# Patient Record
Sex: Female | Born: 1955 | Race: White | Hispanic: No | Marital: Married | Smoking: Never smoker
Health system: Southern US, Community
[De-identification: ages and names within clinical notes are randomized; demographics above are authoritative.]

## PROBLEM LIST (undated history)

## (undated) DIAGNOSIS — Z87440 Personal history of urinary (tract) infections: Secondary | ICD-10-CM

## (undated) DIAGNOSIS — Z8709 Personal history of other diseases of the respiratory system: Secondary | ICD-10-CM

## (undated) DIAGNOSIS — Z8489 Family history of other specified conditions: Secondary | ICD-10-CM

## (undated) DIAGNOSIS — Z8701 Personal history of pneumonia (recurrent): Secondary | ICD-10-CM

## (undated) DIAGNOSIS — C50412 Malignant neoplasm of upper-outer quadrant of left female breast: Principal | ICD-10-CM

## (undated) HISTORY — DX: Malignant neoplasm of upper-outer quadrant of left female breast: C50.412

## (undated) HISTORY — PX: APPENDECTOMY: SHX54

## (undated) HISTORY — PX: ESOPHAGOGASTRODUODENOSCOPY: SHX1529

## (undated) HISTORY — PX: BREAST LUMPECTOMY: SHX2

## (undated) HISTORY — PX: COLONOSCOPY: SHX174

---

## 1999-04-29 ENCOUNTER — Other Ambulatory Visit: Admission: RE | Admit: 1999-04-29 | Discharge: 1999-04-29 | Payer: Self-pay | Admitting: Gynecology

## 1999-09-24 ENCOUNTER — Other Ambulatory Visit: Admission: RE | Admit: 1999-09-24 | Discharge: 1999-09-24 | Payer: Self-pay | Admitting: Gynecology

## 1999-10-31 ENCOUNTER — Encounter: Payer: Self-pay | Admitting: Gynecology

## 1999-10-31 ENCOUNTER — Encounter: Admission: RE | Admit: 1999-10-31 | Discharge: 1999-10-31 | Payer: Self-pay | Admitting: Gynecology

## 2000-06-10 ENCOUNTER — Other Ambulatory Visit: Admission: RE | Admit: 2000-06-10 | Discharge: 2000-06-10 | Payer: Self-pay | Admitting: Gynecology

## 2001-02-01 ENCOUNTER — Encounter: Payer: Self-pay | Admitting: Gynecology

## 2001-02-01 ENCOUNTER — Encounter: Admission: RE | Admit: 2001-02-01 | Discharge: 2001-02-01 | Payer: Self-pay | Admitting: Gynecology

## 2002-04-14 ENCOUNTER — Encounter: Admission: RE | Admit: 2002-04-14 | Discharge: 2002-04-14 | Payer: Self-pay | Admitting: Gynecology

## 2002-04-14 ENCOUNTER — Encounter: Payer: Self-pay | Admitting: Gynecology

## 2002-04-21 ENCOUNTER — Other Ambulatory Visit: Admission: RE | Admit: 2002-04-21 | Discharge: 2002-04-21 | Payer: Self-pay | Admitting: Gynecology

## 2003-05-25 ENCOUNTER — Encounter: Admission: RE | Admit: 2003-05-25 | Discharge: 2003-05-25 | Payer: Self-pay | Admitting: Gynecology

## 2003-06-08 ENCOUNTER — Other Ambulatory Visit: Admission: RE | Admit: 2003-06-08 | Discharge: 2003-06-08 | Payer: Self-pay | Admitting: Gynecology

## 2004-06-06 ENCOUNTER — Encounter: Admission: RE | Admit: 2004-06-06 | Discharge: 2004-06-06 | Payer: Self-pay | Admitting: Gynecology

## 2004-06-13 ENCOUNTER — Other Ambulatory Visit: Admission: RE | Admit: 2004-06-13 | Discharge: 2004-06-13 | Payer: Self-pay | Admitting: Gynecology

## 2004-06-20 ENCOUNTER — Encounter: Admission: RE | Admit: 2004-06-20 | Discharge: 2004-06-20 | Payer: Self-pay | Admitting: Gynecology

## 2005-07-03 ENCOUNTER — Encounter: Admission: RE | Admit: 2005-07-03 | Discharge: 2005-07-03 | Payer: Self-pay | Admitting: Gynecology

## 2006-05-21 ENCOUNTER — Other Ambulatory Visit: Admission: RE | Admit: 2006-05-21 | Discharge: 2006-05-21 | Payer: Self-pay | Admitting: Gynecology

## 2006-07-05 ENCOUNTER — Encounter: Admission: RE | Admit: 2006-07-05 | Discharge: 2006-07-05 | Payer: Self-pay | Admitting: Gynecology

## 2006-07-13 ENCOUNTER — Encounter: Admission: RE | Admit: 2006-07-13 | Discharge: 2006-07-13 | Payer: Self-pay | Admitting: Gynecology

## 2007-07-29 ENCOUNTER — Encounter: Admission: RE | Admit: 2007-07-29 | Discharge: 2007-07-29 | Payer: Self-pay | Admitting: Gynecology

## 2007-08-04 ENCOUNTER — Encounter: Admission: RE | Admit: 2007-08-04 | Discharge: 2007-08-04 | Payer: Self-pay | Admitting: Gynecology

## 2008-09-28 ENCOUNTER — Encounter: Admission: RE | Admit: 2008-09-28 | Discharge: 2008-09-28 | Payer: Self-pay | Admitting: Gynecology

## 2008-10-02 ENCOUNTER — Encounter: Admission: RE | Admit: 2008-10-02 | Discharge: 2008-10-02 | Payer: Self-pay | Admitting: Gynecology

## 2010-07-01 ENCOUNTER — Encounter
Admission: RE | Admit: 2010-07-01 | Discharge: 2010-07-01 | Payer: Self-pay | Source: Home / Self Care | Attending: Obstetrics and Gynecology | Admitting: Obstetrics and Gynecology

## 2010-07-26 ENCOUNTER — Other Ambulatory Visit: Payer: Self-pay | Admitting: Obstetrics and Gynecology

## 2010-07-26 DIAGNOSIS — Z1239 Encounter for other screening for malignant neoplasm of breast: Secondary | ICD-10-CM

## 2010-07-27 ENCOUNTER — Encounter: Payer: Self-pay | Admitting: Gynecology

## 2011-07-03 ENCOUNTER — Ambulatory Visit
Admission: RE | Admit: 2011-07-03 | Discharge: 2011-07-03 | Disposition: A | Discharge status: Home / Self Care | Payer: BC Managed Care – PPO | Source: Ambulatory Visit | Attending: Obstetrics and Gynecology | Admitting: Obstetrics and Gynecology

## 2011-07-03 ENCOUNTER — Other Ambulatory Visit: Payer: Self-pay | Admitting: Obstetrics and Gynecology

## 2011-07-03 DIAGNOSIS — Z1231 Encounter for screening mammogram for malignant neoplasm of breast: Secondary | ICD-10-CM

## 2011-07-03 DIAGNOSIS — Z1239 Encounter for other screening for malignant neoplasm of breast: Secondary | ICD-10-CM

## 2011-08-28 ENCOUNTER — Other Ambulatory Visit (HOSPITAL_COMMUNITY)
Admission: RE | Admit: 2011-08-28 | Discharge: 2011-08-28 | Disposition: A | Discharge status: Home / Self Care | Payer: BC Managed Care – PPO | Source: Ambulatory Visit | Attending: Family Medicine | Admitting: Family Medicine

## 2011-08-28 ENCOUNTER — Other Ambulatory Visit: Payer: Self-pay | Admitting: Family Medicine

## 2011-08-28 DIAGNOSIS — Z01419 Encounter for gynecological examination (general) (routine) without abnormal findings: Secondary | ICD-10-CM | POA: Insufficient documentation

## 2012-07-04 ENCOUNTER — Ambulatory Visit
Admission: RE | Admit: 2012-07-04 | Discharge: 2012-07-04 | Disposition: A | Discharge status: Home / Self Care | Payer: BC Managed Care – PPO | Source: Ambulatory Visit | Attending: Obstetrics and Gynecology | Admitting: Obstetrics and Gynecology

## 2012-07-04 ENCOUNTER — Other Ambulatory Visit: Payer: Self-pay | Admitting: Family Medicine

## 2012-07-04 DIAGNOSIS — Z1231 Encounter for screening mammogram for malignant neoplasm of breast: Secondary | ICD-10-CM

## 2012-11-01 ENCOUNTER — Other Ambulatory Visit: Payer: Self-pay | Admitting: Physician Assistant

## 2013-04-04 ENCOUNTER — Encounter: Payer: Self-pay | Admitting: Family Medicine

## 2013-04-04 ENCOUNTER — Ambulatory Visit (INDEPENDENT_AMBULATORY_CARE_PROVIDER_SITE_OTHER): Payer: BC Managed Care – PPO | Admitting: Family Medicine

## 2013-04-04 VITALS — Ht 66.0 in | Wt 163.8 lb

## 2013-04-04 DIAGNOSIS — E663 Overweight: Secondary | ICD-10-CM

## 2013-04-04 DIAGNOSIS — E162 Hypoglycemia, unspecified: Secondary | ICD-10-CM

## 2013-04-04 NOTE — Patient Instructions (Addendum)
-   Please see Dr. Cliffton Asters for a checkup and approval to start an exercise program that includes weight training.   - Recommendation:  Start walking at least 30 minutes 3 times a week.  - Record EACH DAY'S steps.    - Give yourself a fitness test such as climbing one flight of stairs, and repeat this after 4 weeks of doing your walking.  Record how long it takes as well as your perceived exertion (1-10).   - Sit to stand:  Practice this XX times per day.  You should feel some fatigue in your quads, which means you will be building strength.   - Obtain twice as many veg's as protein or carbohydrate foods for both lunch and dinner. - For the next 1-2 weeks, record what and how much you have for breakfast AND what time you ate it, as well as the time at which you first notice hunger.    - Some suggestions: You will probably be sustained longer with a breakfast with protein in it, and with FIBER in any starchy food you choose, i.e., in choosing a     cereal, look for at least 5 grams of fiber per serving.  Yogurt may work better than milk.  Less sugar at breakfast may also help your appetite control through     the day.  - TASTE PREFERENCES ARE LEARNED, AND PREFERENCE FOLLOWS PRACTICE.  This means that it will get easier to choose foods you know are good for you if you are exposed to them enough.    - Hot flashes:  You may want to see Jesusita Oka at [facility].  (Also consider asking Dr. Cliffton Asters about Vivelle dot estrogen patch and Prometrium progesterone.)  - Google SARCOPENIA and do another search on HYPER-PALATBLE FOODS and another search on SUGAR HEALTH EFFECTS.

## 2013-04-04 NOTE — Progress Notes (Signed)
Medical Nutrition Therapy:  Appt start time: 1330 end time:  1430.  Assessment:  Primary concerns today: Weight management.  Misty Lamb is still experiencing hot flashes, and often feels like she has a low-grade fever, although she does not.  She sometimes has symptoms of hypoglycemia, which are relieved by eating.  She usually cannot make it through the morning without a snack.  She also knows she needs to lose weight, and doing so would help her to feel better.   Usual eating pattern includes 3 meals and 2-3 snacks per day. Usual physical activity includes ~[id number] steps a day, tracked on a Gar[employer] steps tracker; she is participating in an activity competition at work through the end of October.  Her walking is just in activities of daily living; she has never participated in an exercise regimen.  Jaz said she often has trouble getting up and started walking b/c she feels so stiff and weak in her legs.  I suspect this is due to simple muscle loss, but encouraged her to see Dr. Cliffton Asters for an evaluation prior to beginning an exercise program other than walking 30 min 3 X wk.    24-hr recall: (Up at 5:30 AM) B (6 AM)-   3/4 c Cheerios, 1 c f-f milk, coffee w/ <1 tbsp cream Snk (10 AM)-   8 carrots, 1 laughing cow (35), water L (12 PM)-  Chipotle: grilled chx salad w/ chs & sour cream, chips, water Snk ( PM)-  none D (6:30 PM)-  2 c corn & butter beans, water Snk (9 PM)-  1 c lemonade, 1 WW toffee ice cream (90) Yesterday's lunch was heavier than usual b/c she went out w/ co-workers.  Usually brings lunch.    Progress Towards Goal(s):  In progress.   Nutritional Diagnosis:  NB-2.1 Physical inactivity As related to disinterest.  As evidenced by no regular activity.    Intervention:  Nutrition education.  Monitoring/Evaluation:  Dietary intake, exercise, and body weight in 4 week(s).

## 2013-05-02 ENCOUNTER — Ambulatory Visit: Payer: BC Managed Care – PPO | Admitting: Family Medicine

## 2013-06-06 ENCOUNTER — Other Ambulatory Visit: Payer: Self-pay | Admitting: Obstetrics and Gynecology

## 2013-06-22 ENCOUNTER — Ambulatory Visit: Payer: BC Managed Care – PPO | Admitting: Family Medicine

## 2013-07-04 ENCOUNTER — Other Ambulatory Visit: Payer: Self-pay

## 2013-07-04 DIAGNOSIS — Z1231 Encounter for screening mammogram for malignant neoplasm of breast: Secondary | ICD-10-CM

## 2013-07-05 ENCOUNTER — Ambulatory Visit
Admission: RE | Admit: 2013-07-05 | Discharge: 2013-07-05 | Disposition: A | Discharge status: Home / Self Care | Payer: BC Managed Care – PPO | Source: Ambulatory Visit

## 2013-07-05 DIAGNOSIS — Z1231 Encounter for screening mammogram for malignant neoplasm of breast: Secondary | ICD-10-CM

## 2013-07-13 ENCOUNTER — Other Ambulatory Visit: Payer: Self-pay | Admitting: Obstetrics and Gynecology

## 2013-07-13 DIAGNOSIS — R928 Other abnormal and inconclusive findings on diagnostic imaging of breast: Secondary | ICD-10-CM

## 2013-07-21 ENCOUNTER — Ambulatory Visit
Admission: RE | Admit: 2013-07-21 | Discharge: 2013-07-21 | Disposition: A | Discharge status: Home / Self Care | Payer: BC Managed Care – PPO | Source: Ambulatory Visit | Attending: Obstetrics and Gynecology | Admitting: Obstetrics and Gynecology

## 2013-07-21 DIAGNOSIS — R928 Other abnormal and inconclusive findings on diagnostic imaging of breast: Secondary | ICD-10-CM

## 2013-12-07 ENCOUNTER — Other Ambulatory Visit: Payer: Self-pay | Admitting: Obstetrics and Gynecology

## 2013-12-07 DIAGNOSIS — N6489 Other specified disorders of breast: Secondary | ICD-10-CM

## 2013-12-18 ENCOUNTER — Ambulatory Visit
Admission: RE | Admit: 2013-12-18 | Discharge: 2013-12-18 | Disposition: A | Discharge status: Home / Self Care | Payer: BC Managed Care – PPO | Source: Ambulatory Visit | Attending: Obstetrics and Gynecology | Admitting: Obstetrics and Gynecology

## 2013-12-18 DIAGNOSIS — N6489 Other specified disorders of breast: Secondary | ICD-10-CM

## 2014-07-13 ENCOUNTER — Other Ambulatory Visit: Payer: Self-pay | Admitting: Obstetrics and Gynecology

## 2014-07-13 DIAGNOSIS — N6320 Unspecified lump in the left breast, unspecified quadrant: Secondary | ICD-10-CM

## 2014-07-24 ENCOUNTER — Other Ambulatory Visit: Payer: Self-pay | Admitting: Obstetrics and Gynecology

## 2014-07-24 ENCOUNTER — Ambulatory Visit
Admission: RE | Admit: 2014-07-24 | Discharge: 2014-07-24 | Disposition: A | Discharge status: Home / Self Care | Payer: BLUE CROSS/BLUE SHIELD | Source: Ambulatory Visit | Attending: Obstetrics and Gynecology | Admitting: Obstetrics and Gynecology

## 2014-07-24 DIAGNOSIS — N6320 Unspecified lump in the left breast, unspecified quadrant: Secondary | ICD-10-CM

## 2014-09-12 ENCOUNTER — Other Ambulatory Visit: Payer: Self-pay | Admitting: Obstetrics and Gynecology

## 2014-09-13 LAB — CYTOLOGY - PAP

## 2015-01-17 ENCOUNTER — Other Ambulatory Visit: Payer: Self-pay | Admitting: Family Medicine

## 2015-01-17 DIAGNOSIS — N6320 Unspecified lump in the left breast, unspecified quadrant: Secondary | ICD-10-CM

## 2015-01-31 ENCOUNTER — Other Ambulatory Visit: Payer: Self-pay | Admitting: Family Medicine

## 2015-01-31 ENCOUNTER — Ambulatory Visit
Admission: RE | Admit: 2015-01-31 | Discharge: 2015-01-31 | Disposition: A | Discharge status: Home / Self Care | Payer: BLUE CROSS/BLUE SHIELD | Source: Ambulatory Visit | Attending: Family Medicine | Admitting: Family Medicine

## 2015-01-31 DIAGNOSIS — N6320 Unspecified lump in the left breast, unspecified quadrant: Secondary | ICD-10-CM

## 2015-01-31 DIAGNOSIS — R921 Mammographic calcification found on diagnostic imaging of breast: Secondary | ICD-10-CM

## 2015-02-04 ENCOUNTER — Encounter: Payer: Self-pay | Admitting: *Deleted

## 2015-02-04 ENCOUNTER — Telehealth: Payer: Self-pay | Admitting: *Deleted

## 2015-02-04 DIAGNOSIS — C50412 Malignant neoplasm of upper-outer quadrant of left female breast: Secondary | ICD-10-CM

## 2015-02-04 HISTORY — DX: Malignant neoplasm of upper-outer quadrant of left female breast: C50.412

## 2015-02-04 NOTE — Telephone Encounter (Signed)
Confirmed [facility] for 02/06/15 at 0800 .  Instructions and contact information given.

## 2015-02-05 ENCOUNTER — Telehealth: Payer: Self-pay | Admitting: *Deleted

## 2015-02-05 DIAGNOSIS — C50412 Malignant neoplasm of upper-outer quadrant of left female breast: Secondary | ICD-10-CM

## 2015-02-05 NOTE — Telephone Encounter (Signed)
Pt called to relate she needed to change to he pm clinic. Appt changed and [facility] practice notified.

## 2015-02-06 ENCOUNTER — Ambulatory Visit: Payer: BLUE CROSS/BLUE SHIELD

## 2015-02-06 ENCOUNTER — Encounter: Payer: Self-pay | Admitting: Hematology and Oncology

## 2015-02-06 ENCOUNTER — Ambulatory Visit (HOSPITAL_BASED_OUTPATIENT_CLINIC_OR_DEPARTMENT_OTHER): Payer: BLUE CROSS/BLUE SHIELD | Admitting: Hematology and Oncology

## 2015-02-06 ENCOUNTER — Other Ambulatory Visit: Payer: BLUE CROSS/BLUE SHIELD

## 2015-02-06 ENCOUNTER — Encounter: Payer: Self-pay | Admitting: General Practice

## 2015-02-06 ENCOUNTER — Encounter (INDEPENDENT_AMBULATORY_CARE_PROVIDER_SITE_OTHER): Payer: Self-pay

## 2015-02-06 ENCOUNTER — Ambulatory Visit
Admission: RE | Admit: 2015-02-06 | Discharge: 2015-02-06 | Disposition: A | Discharge status: Home / Self Care | Payer: BLUE CROSS/BLUE SHIELD | Source: Ambulatory Visit | Attending: Radiation Oncology | Admitting: Radiation Oncology

## 2015-02-06 ENCOUNTER — Other Ambulatory Visit: Payer: Self-pay | Admitting: General Surgery

## 2015-02-06 ENCOUNTER — Ambulatory Visit: Payer: BLUE CROSS/BLUE SHIELD | Admitting: Oncology

## 2015-02-06 ENCOUNTER — Encounter: Payer: Self-pay | Admitting: Nurse Practitioner

## 2015-02-06 ENCOUNTER — Other Ambulatory Visit (HOSPITAL_BASED_OUTPATIENT_CLINIC_OR_DEPARTMENT_OTHER): Payer: BLUE CROSS/BLUE SHIELD

## 2015-02-06 VITALS — BP 163/85 | HR 78 | Temp 98.3°F | Resp 18 | Ht 66.0 in | Wt 159.0 lb

## 2015-02-06 DIAGNOSIS — D0512 Intraductal carcinoma in situ of left breast: Secondary | ICD-10-CM

## 2015-02-06 DIAGNOSIS — C50412 Malignant neoplasm of upper-outer quadrant of left female breast: Secondary | ICD-10-CM

## 2015-02-06 DIAGNOSIS — Z170 Estrogen receptor positive status [ER+]: Secondary | ICD-10-CM | POA: Diagnosis not present

## 2015-02-06 DIAGNOSIS — Z803 Family history of malignant neoplasm of breast: Secondary | ICD-10-CM

## 2015-02-06 LAB — COMPREHENSIVE METABOLIC PANEL (CC13)
ALK PHOS: 73 U/L (ref 40–150)
ALT: 19 U/L (ref 0–55)
AST: 17 U/L (ref 5–34)
Albumin: 4.1 g/dL (ref 3.5–5.0)
Anion Gap: 6 mEq/L (ref 3–11)
BILIRUBIN TOTAL: 0.69 mg/dL (ref 0.20–1.20)
BUN: 19.2 mg/dL (ref 7.0–26.0)
CALCIUM: 9.3 mg/dL (ref 8.4–10.4)
CHLORIDE: 108 meq/L (ref 98–109)
CO2: 27 mEq/L (ref 22–29)
CREATININE: 0.7 mg/dL (ref 0.6–1.1)
Glucose: 95 mg/dl (ref 70–140)
POTASSIUM: 4 meq/L (ref 3.5–5.1)
Sodium: 142 mEq/L (ref 136–145)
Total Protein: 7.6 g/dL (ref 6.4–8.3)

## 2015-02-06 LAB — CBC WITH DIFFERENTIAL/PLATELET
BASO%: 0.2 % (ref 0.0–2.0)
Basophils Absolute: 0 10*3/uL (ref 0.0–0.1)
EOS%: 1.1 % (ref 0.0–7.0)
Eosinophils Absolute: 0.1 10*3/uL (ref 0.0–0.5)
HCT: 43.2 % (ref 34.8–46.6)
HEMOGLOBIN: 14.4 g/dL (ref 11.6–15.9)
LYMPH%: 20.2 % (ref 14.0–49.7)
MCH: 29.5 pg (ref 25.1–34.0)
MCHC: 33.3 g/dL (ref 31.5–36.0)
MCV: 88.5 fL (ref 79.5–101.0)
MONO#: 0.6 10*3/uL (ref 0.1–0.9)
MONO%: 6.9 % (ref 0.0–14.0)
NEUT#: 6.7 10*3/uL — ABNORMAL HIGH (ref 1.5–6.5)
NEUT%: 71.6 % (ref 38.4–76.8)
Platelets: 240 10*3/uL (ref 145–400)
RBC: 4.88 10*6/uL (ref 3.70–5.45)
RDW: 12.5 % (ref 11.2–14.5)
WBC: 9.3 10*3/uL (ref 3.9–10.3)
lymph#: 1.9 10*3/uL (ref 0.9–3.3)

## 2015-02-06 NOTE — Progress Notes (Signed)
Checked in new patient with no issues prior to seeing the dr. She has not traveled and has been given her breast care alliance packet. I advised her of alight fund and gave her Lenise's card if needed.

## 2015-02-06 NOTE — Progress Notes (Signed)
Misty Lamb is a very pleasant 59 y.o. 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  Biopsy results revealed the tumor's prognostic profile is ER positive and PR positive.  She presents today with her husband and daughter to the [facility]) for treatment consideration and recommendations from the breast surgeon, radiation oncologist, and medical oncologist.     I briefly met with Misty Lamb, her husband, and her daughter during her [facility] visit today. We discussed the purpose of the Survivorship Clinic, which will include monitoring for recurrence, coordinating completion of age and gender-appropriate cancer screenings, promotion of overall wellness, as well as managing potential late/long-term side effects of anti-cancer treatments.    The treatment plan for Misty Lamb will likely include surgery, radiation therapy, and anti-estrogen therapy.  As of today, the intent of treatment for Misty Lamb is cure, therefore she will be eligible for the Survivorship Clinic upon her completion of treatment.  Her survivorship care plan (SCP) document will be drafted and updated throughout the course of her treatment trajectory. She will receive the SCP in an office visit with myself in the Survivorship Clinic once she has completed treatment.   Misty Lamb was encouraged to ask questions and all questions were answered to her satisfaction.  She was given my business card and encouraged to contact me with any concerns regarding survivorship.  I look forward to participating in her care.   Kenn File, [place] [phone]

## 2015-02-06 NOTE — Progress Notes (Addendum)
[facility] NEW PATIENT EVALUATION  Name: Misty Lamb MRN: [id number]  Date:   02/06/2015           DOB: 11-21-1955  Status: outpatient   CC: Vidal Schwalbe, MD Dr. Autumn Messing, III   REFERRING PHYSICIAN:  Dr. 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 female who is seen today through the courtesy of Dr. Marlou Starks at the breast multidisciplinary clinic for evaluation of her DCIS of the left breast.  At the time of a six-month follow-up mammogram for what was found to represent a fibroadenoma, she was noted to have a 3 x 3 x 4 mm area of suspicious calcifications within the upper-outer quadrant.  There were a few calcifications posterior to this tight group of calcifications; altogether the calcifications including the dominant group measured 1.0 x 1.1 x 1.5 cm.  Ultrasound did not show any change in a mass at 3:00 measuring 7 x 6 x 4 mm felt to represent a fibroadenoma.  A biopsy of the calcifications on 01/31/2015 revealed low-grade DCIS with calcifications and also "fibroadenoma".  The DCIS was ER/PR positive.  She is without complaints today.  She is seen today with Dr. Marlou Starks and Dr. Lindi Adie.  PREVIOUS RADIATION THERAPY: No   PAST MEDICAL HISTORY:  has a past medical history of Breast cancer of upper-outer quadrant of left female breast (02/04/2015).     PAST SURGICAL HISTORY:  Past Surgical History  Procedure Laterality Date  . Appendectomy       FAMILY HISTORY: family history includes Breast cancer in her mother; Liver cancer in her mother.  Her father died of a stroke at 56 and her mother died of what sounds like hepatocellular carcinoma at age 22    SOCIAL HISTORY:  reports that she has never smoked. She has never used smokeless tobacco. She reports that she does not drink alcohol or use illicit drugs.  Married, 3 children.     ALLERGIES: Review of patient's  allergies indicates not on file.   MEDICATIONS:  Current Outpatient Prescriptions  Medication Sig Dispense Refill  . Multiple Vitamins-Minerals (MULTIVITAMIN ADULT PO) Take by mouth.     No current facility-administered medications for this encounter.     REVIEW OF SYSTEMS:  Pertinent items are noted in HPI.    PHYSICAL EXAM:  alert and oriented 59 year old white female appearing younger than her stated age.   Wt Readings from Last 3 Encounters:  02/06/15 159 lb (72.122 kg)  04/04/13 163 lb 12.8 oz (74.299 kg)   Temp Readings from Last 3 Encounters:  02/06/15 98.3 F (36.8 C) Oral   BP Readings from Last 3 Encounters:  02/06/15 163/85   Pulse Readings from Last 3 Encounters:  02/06/15 78   Head and neck examination: Grossly unremarkable.  Nodes: Without palpable cervical, supraclavicular, or axillary lymphadenopathy.  Breasts: There is a biopsy wound at approximately 1:00 along the upper outer quadrant of the left breast.  No masses are appreciated.  Right breast without masses or lesions.  Extremities: Without edema.      LABORATORY DATA:  Lab Results  Component Value Date   WBC 9.3 02/06/2015   HGB 14.4 02/06/2015   HCT 43.2 02/06/2015   MCV 88.5 02/06/2015   PLT 240 02/06/2015   Lab Results  Component Value Date   NA 142 02/06/2015   K 4.0  02/06/2015   CO2 27 02/06/2015   Lab Results  Component Value Date   ALT 19 02/06/2015   AST 17 02/06/2015   ALKPHOS 73 02/06/2015   BILITOT 0.69 02/06/2015      IMPRESSION: Stage 0 (Tis N0 M0) low-grade DCIS of the left breast.  We discussed local management options which in fluid mastectomy versus partial mastectomy followed by radiation therapy.  We discussed the potential acute and late toxicities of radiation therapy.  She may be a candidate for hypofractionated treatment.  We also discussed obtaining a mammogram prior to initiation of radiation therapy to confirm removal of all suspicious microcalcifications.  We  also discussed deep inspiration breath-hold technology to avoid cardiac irradiation.    PLAN: As discussed above.     I spent 30 minutes minutes face to face with the patient and more than 50% of that time was spent in counseling and/or coordination of care.

## 2015-02-06 NOTE — Progress Notes (Signed)
[facility] NOTE  Patient Care Team: Harlan Stains, MD as PCP - General (Family Medicine) Kennith Center, RD as Dietitian (Family Medicine) Arloa Koh, MD as Consulting Physician (Radiation Oncology) Mauro Kaufmann, RN as Registered Nurse Rockwell Germany, RN as Registered Nurse Autumn Messing III, MD as Consulting Physician (General Surgery) Nicholas Lose, MD as Consulting Physician (Hematology and Oncology)  CHIEF COMPLAINTS/PURPOSE OF CONSULTATION:  Newly diagnosed breast cancer  HISTORY OF PRESENTING ILLNESS:  Misty Lamb 59 y.o. female is here because of recent diagnosis of left breast DCIS. She had a previous mammogram 6 months ago. This showed some abnormalities of her followed up by a repeat mammogram recently which showed left breast calcifications plus a fibroadenoma. The calcifications span 15 mm in size. The fibroadenoma was found to be stable at 3:00 position measuring 7 mm. Ultrasound was performed and a biopsy revealed low-grade DCIS with fibroadenoma. It was ER/PR positive. She was presented this morning at the multidisciplinary tumor board and she is here today to discuss a treatment plan.   I reviewed her records extensively and collaborated the history with the patient.  SUMMARY OF ONCOLOGIC HISTORY:   Breast cancer of upper-outer quadrant of left female breast   01/31/2015 Mammogram Six-month reevaluation of left breast calcifications showed increase in calcifications 15 mm span; fibroadenoma measuring 7 mm the 3:00 position stable   01/31/2015 Initial Diagnosis Left breast biopsy: DCIS with calcifications, fibroadenoma, low-grade, ER 100%, PR 10%    In terms of breast cancer risk profile:  She menarched at early age of 50 and went to menopause at age 28  She had 3 pregnancy, her first child was born at age 17  She has received birth control pills for approximately 2 years.  She was never exposed to fertility medications or hormone replacement  therapy.  She has  family history of Breast/GYN/GI cancer Mother age 60 with breast cancer  MEDICAL HISTORY:  Past Medical History  Diagnosis Date  . Breast cancer of upper-outer quadrant of left female breast 02/04/2015    SURGICAL HISTORY: Past Surgical History  Procedure Laterality Date  . Appendectomy      SOCIAL HISTORY: History   Social History  . Marital Status: Married    Spouse Name: N/A  . Number of Children: N/A  . Years of Education: N/A   Occupational History  . Not on file.   Social History Main Topics  . Smoking status: Never Smoker   . Smokeless tobacco: Never Used  . Alcohol Use: No  . Drug Use: No  . Sexual Activity: Not on file   Other Topics Concern  . Not on file   Social History Narrative    FAMILY HISTORY: Family History  Problem Relation Age of Onset  . Breast cancer Mother   . Liver cancer Mother     ALLERGIES:  has no allergies on file.  MEDICATIONS:  Current Outpatient Prescriptions  Medication Sig Dispense Refill  . Multiple Vitamins-Minerals (MULTIVITAMIN ADULT PO) Take by mouth.     No current facility-administered medications for this visit.    REVIEW OF SYSTEMS:   Constitutional: Denies fevers, chills or abnormal night sweats Eyes: Denies blurriness of vision, double vision or watery eyes Ears, nose, mouth, throat, and face: Denies mucositis or sore throat Respiratory: Denies cough, dyspnea or wheezes Cardiovascular: Denies palpitation, chest discomfort or lower extremity swelling Gastrointestinal:  Denies nausea, heartburn or change in bowel habits Skin: Denies abnormal skin rashes Lymphatics: Denies new  lymphadenopathy or easy bruising Neurological:Denies numbness, tingling or new weaknesses Behavioral/Psych: Mood is stable, no new changes  Breast:  Denies any palpable lumps or discharge All other systems were reviewed with the patient and are negative.  PHYSICAL EXAMINATION: ECOG PERFORMANCE STATUS: 0 -  Asymptomatic  Filed Vitals:   02/06/15 1435  BP: 163/85  Pulse: 78  Temp: 98.3 F (36.8 C)  Resp: 18   Filed Weights   02/06/15 1435  Weight: 159 lb (72.122 kg)    GENERAL:alert, no distress and comfortable SKIN: skin color, texture, turgor are normal, no rashes or significant lesions EYES: normal, conjunctiva are pink and non-injected, sclera clear OROPHARYNX:no exudate, no erythema and lips, buccal mucosa, and tongue normal  NECK: supple, thyroid normal size, non-tender, without nodularity LYMPH:  no palpable lymphadenopathy in the cervical, axillary or inguinal LUNGS: clear to auscultation and percussion with normal breathing effort HEART: regular rate & rhythm and no murmurs and no lower extremity edema ABDOMEN:abdomen soft, non-tender and normal bowel sounds Musculoskeletal:no cyanosis of digits and no clubbing  PSYCH: alert & oriented x 3 with fluent speech NEURO: no focal motor/sensory deficits BREAST: No palpable nodules in breast. No palpable axillary or supraclavicular lymphadenopathy (exam performed in the presence of a chaperone)   LABORATORY DATA:  I have reviewed the data as listed Lab Results  Component Value Date   WBC 9.3 02/06/2015   HGB 14.4 02/06/2015   HCT 43.2 02/06/2015   MCV 88.5 02/06/2015   PLT 240 02/06/2015   Lab Results  Component Value Date   NA 142 02/06/2015   K 4.0 02/06/2015   CO2 27 02/06/2015    RADIOGRAPHIC STUDIES: I have personally reviewed the radiological reports and agreed with the findings in the report. Results are summarized as above  ASSESSMENT AND PLAN:  Breast cancer of upper-outer quadrant of left female breast Diagnosis: Left breast biopsy 01/23/2015: DCIS with calcifications, fibroadenoma, low-grade, ER 100%, PR 10% Radiology review:Six-month reevaluation of left breast calcifications showed increase in calcifications 15 mm span; fibroadenoma measuring 7 mm the 3:00 position stable  Pathology review: I discussed  with the patient the difference between DCIS and invasive breast cancer. It is considered a precancerous lesion. DCIS is classified as a 0. It is generally detected through mammograms as calcifications. We discussed the significance of grades and its impact on prognosis. We also discussed the importance of ER and PR receptors and their implications to adjuvant treatment options. Prognosis of DCIS dependence on grade and comedo necrosis. It is anticipated that if not treated, 20-30% of DCIS can develop into invasive breast cancer.  Recommendation: 1. Breast conserving surgery 2. Followed by adjuvant radiation therapy 3. Followed by antiestrogen therapy with tamoxifen 5 years  Tamoxifen counseling: We discussed the risks and benefits of tamoxifen. These include but not limited to insomnia, hot flashes, mood changes, vaginal dryness, and weight gain. Although rare, serious side effects including endometrial cancer, risk of blood clots were also discussed. We strongly believe that the benefits far outweigh the risks. Patient understands these risks and consented to starting treatment. Planned treatment duration is 5 years.  Return to clinic after surgery to discuss the final pathology report and come up with an adjuvant treatment plan.     All questions were answered. The patient knows to call the clinic with any problems, questions or concerns.    Rulon Eisenmenger, MD 2:41 PM

## 2015-02-06 NOTE — Assessment & Plan Note (Signed)
Diagnosis: Left breast biopsy 01/23/2015: DCIS with calcifications, fibroadenoma, low-grade, ER 100%, PR 10% Radiology review:Six-month reevaluation of left breast calcifications showed increase in calcifications 15 mm span; fibroadenoma measuring 7 mm the 3:00 position stable  Pathology review: I discussed with the patient the difference between DCIS and invasive breast cancer. It is considered a precancerous lesion. DCIS is classified as a 0. It is generally detected through mammograms as calcifications. We discussed the significance of grades and its impact on prognosis. We also discussed the importance of ER and PR receptors and their implications to adjuvant treatment options. Prognosis of DCIS dependence on grade and comedo necrosis. It is anticipated that if not treated, 20-30% of DCIS can develop into invasive breast cancer.  Recommendation: 1. Breast conserving surgery 2. Followed by adjuvant radiation therapy 3. Followed by antiestrogen therapy with tamoxifen 5 years  Tamoxifen counseling: We discussed the risks and benefits of tamoxifen. These include but not limited to insomnia, hot flashes, mood changes, vaginal dryness, and weight gain. Although rare, serious side effects including endometrial cancer, risk of blood clots were also discussed. We strongly believe that the benefits far outweigh the risks. Patient understands these risks and consented to starting treatment. Planned treatment duration is 5 years.  Return to clinic after surgery to discuss the final pathology report and come up with an adjuvant treatment plan.

## 2015-02-07 ENCOUNTER — Other Ambulatory Visit: Payer: Self-pay | Admitting: General Surgery

## 2015-02-07 DIAGNOSIS — D0512 Intraductal carcinoma in situ of left breast: Secondary | ICD-10-CM

## 2015-02-07 NOTE — Progress Notes (Signed)
[place] Psychosocial Distress Screening Spiritual Care  Met with Misty Lamb, her husband, and their oldest daughter at [facility] to introduce [facility] team/resources, reviewing distress screen per protocol.  The patient scored a 2 on the Psychosocial Distress Thermometer which indicates mild distress. Also assessed for distress and other psychosocial needs.   ONCBCN DISTRESS SCREENING 02/07/2015  Screening Type Initial Screening  Distress experienced in past week (1-10) 2  Referral to support programs Yes  Other [place] and husband have been married 86 years; per pt, husband, three children, and grandchildren are strong sources of support.  Pt reports good support from her [employer] in [place]; prayer and faith are of central importance to her in meaning-making and in coping with challenges.  Per pt, her faith is keeping her distress level low:  "I have complete peace that God is in control of my situation."  She and her family were upbeat and relieved at clinic, citing encouraging news and quality care from medical team.    Follow up needed: No.  Family is aware of ongoing chaplain availability for support as desired and has print materials about [facility] and team contact info.  Please also page as needs arise.  Thank you.  [place], [place] Pager [phone] Voicemail  [phone]

## 2015-02-11 ENCOUNTER — Telehealth: Payer: Self-pay | Admitting: Hematology and Oncology

## 2015-02-11 ENCOUNTER — Telehealth: Payer: Self-pay | Admitting: *Deleted

## 2015-02-11 ENCOUNTER — Other Ambulatory Visit: Payer: Self-pay | Admitting: *Deleted

## 2015-02-11 NOTE — Telephone Encounter (Signed)
Patient is aware of her post op appointment

## 2015-02-11 NOTE — Telephone Encounter (Signed)
Spoke with patient from [facility] 8/3.  She is doing well.  She is scheduled for surgery 8/25.  No questions or concerns at this time.  Encouraged her to call with any needs or concerns.

## 2015-02-20 ENCOUNTER — Encounter (HOSPITAL_COMMUNITY): Payer: Self-pay

## 2015-02-20 ENCOUNTER — Encounter (HOSPITAL_COMMUNITY)
Admission: RE | Admit: 2015-02-20 | Discharge: 2015-02-20 | Disposition: A | Discharge status: Home / Self Care | Payer: BLUE CROSS/BLUE SHIELD | Source: Ambulatory Visit | Attending: General Surgery | Admitting: General Surgery

## 2015-02-20 DIAGNOSIS — D0512 Intraductal carcinoma in situ of left breast: Secondary | ICD-10-CM | POA: Insufficient documentation

## 2015-02-20 DIAGNOSIS — Z01812 Encounter for preprocedural laboratory examination: Secondary | ICD-10-CM | POA: Insufficient documentation

## 2015-02-20 HISTORY — DX: Personal history of urinary (tract) infections: Z87.440

## 2015-02-20 HISTORY — DX: Personal history of pneumonia (recurrent): Z87.01

## 2015-02-20 HISTORY — DX: Personal history of other diseases of the respiratory system: Z87.09

## 2015-02-20 HISTORY — DX: Family history of other specified conditions: Z84.89

## 2015-02-20 LAB — BASIC METABOLIC PANEL
Anion gap: 8 (ref 5–15)
BUN: 10 mg/dL (ref 6–20)
CHLORIDE: 105 mmol/L (ref 101–111)
CO2: 28 mmol/L (ref 22–32)
Calcium: 9.2 mg/dL (ref 8.9–10.3)
Creatinine, Ser: 0.58 mg/dL (ref 0.44–1.00)
GFR calc Af Amer: >60 mL/min (ref >60)
GLUCOSE: 96 mg/dL (ref 65–99)
Potassium: 3.6 mmol/L (ref 3.5–5.1)
SODIUM: 141 mmol/L (ref 135–145)

## 2015-02-20 LAB — CBC
HCT: 43.9 % (ref 36.0–46.0)
Hemoglobin: 14.7 g/dL (ref 12.0–15.0)
MCH: 29.6 pg (ref 26.0–34.0)
MCHC: 33.5 g/dL (ref 30.0–36.0)
MCV: 88.5 fL (ref 78.0–100.0)
Platelets: 260 10*3/uL (ref 150–400)
RBC: 4.96 MIL/uL (ref 3.87–5.11)
RDW: 12.8 % (ref 11.5–15.5)
WBC: 6.7 10*3/uL (ref 4.0–10.5)

## 2015-02-20 NOTE — Progress Notes (Signed)
PCP- Dr. Harlan Stains Cardiologist - Denies EKG/CXR - denies Echo/Stress Test/Cardiac Cath - pt. Denies  Pt. Denies shortness of breath and denies chest pain.

## 2015-02-20 NOTE — Pre-Procedure Instructions (Signed)
Misty Lamb  02/20/2015      [id number] - [place], [place] - [place] [address] [place]y Lamb [id number] Phone: [phone] Fax: [phone]    Your procedure is scheduled on Thursday, February 28, 2015  Report to [facility] Admitting at 7:30 A.M.  Call this number if you have problems the morning of surgery:  [phone]   Remember:  Do not eat food or drink liquids after midnight Wednesday, February 27, 2015  Take these medicines the morning of surgery with A SIP OF WATER: None  Stop taking Aspirin, vitamins and herbal medications. Do not take any NSAIDs ie: Ibuprofen, Advil, Naproxen or any medication containing Aspirin; stop 1 week prior to procedure ( Thursday, February 21, 2015)  Do not wear jewelry, make-up or nail polish.  Do not wear lotions, powders, or perfumes.  You may not wear deodorant.  Do not shave 48 hours prior to surgery.    Do not bring valuables to the hospital.  [facility] is not responsible for any belongings or valuables.  Contacts, dentures or bridgework may not be worn into surgery.  Leave your suitcase in the car.  After surgery it may be brought to your room.  For patients admitted to the hospital, discharge time will be determined by your treatment team.  Patients discharged the day of surgery will not be allowed to drive home.   Name and phone number of your driver:   Special instructions: Special Instructions:Special Instructions: [facility] - Preparing for Surgery  Before surgery, you can play an important role.  Because skin is not sterile, your skin needs to be as free of germs as possible.  You can reduce the number of germs on you skin by washing with CHG (chlorahexidine gluconate) soap before surgery.  CHG is an antiseptic cleaner which kills germs and bonds with the skin to continue killing germs even after washing.  Please DO NOT use if you have an allergy to CHG or antibacterial soaps.  If  your skin becomes reddened/irritated stop using the CHG and inform your nurse when you arrive at Short Stay.  Do not shave (including legs and underarms) for at least 48 hours prior to the first CHG shower.  You may shave your face.  Please follow these instructions carefully:   1.  Shower with CHG Soap the night before surgery and the morning of Surgery.  2.  If you choose to wash your hair, wash your hair first as usual with your normal shampoo.  3.  After you shampoo, rinse your hair and body thoroughly to remove the Shampoo.  4.  Use CHG as you would any other liquid soap.  You can apply chg directly  to the skin and wash gently with scrungie or a clean washcloth.  5.  Apply the CHG Soap to your body ONLY FROM THE NECK DOWN.  Do not use on open wounds or open sores.  Avoid contact with your eyes, ears, mouth and genitals (private parts).  Wash genitals (private parts) with your normal soap.  6.  Wash thoroughly, paying special attention to the area where your surgery will be performed.  7.  Thoroughly rinse your body with warm water from the neck down.  8.  DO NOT shower/wash with your normal soap after using and rinsing off the CHG Soap.  9.  Pat yourself dry with a clean towel.  10.  Wear clean pajamas.            11.  Place clean sheets on your bed the night of your first shower and do not sleep with pets.  Day of Surgery  Do not apply any lotions/deodorants the morning of surgery.  Please wear clean clothes to the hospital/surgery center.  Please read over the following fact sheets that you were given. Pain Booklet, Coughing and Deep Breathing and Surgical Site Infection Prevention

## 2015-02-25 ENCOUNTER — Ambulatory Visit
Admission: RE | Admit: 2015-02-25 | Discharge: 2015-02-25 | Disposition: A | Discharge status: Home / Self Care | Payer: BLUE CROSS/BLUE SHIELD | Source: Ambulatory Visit | Attending: General Surgery | Admitting: General Surgery

## 2015-02-25 DIAGNOSIS — D0512 Intraductal carcinoma in situ of left breast: Secondary | ICD-10-CM

## 2015-02-27 MED ORDER — CHLORHEXIDINE GLUCONATE 4 % EX LIQD: 1.0000 "application " | Freq: Once | CUTANEOUS | Status: DC

## 2015-02-27 MED ORDER — CEFAZOLIN SODIUM-DEXTROSE 2-3 GM-% IV SOLR
2.0000 g | INTRAVENOUS | Status: AC
  Administered 2015-02-28: 2 g via INTRAVENOUS
  Filled 2015-02-27: qty 50

## 2015-02-28 ENCOUNTER — Telehealth: Payer: Self-pay

## 2015-02-28 ENCOUNTER — Ambulatory Visit
Admission: RE | Admit: 2015-02-28 | Discharge: 2015-02-28 | Disposition: A | Discharge status: Home / Self Care | Payer: BLUE CROSS/BLUE SHIELD | Source: Ambulatory Visit | Attending: General Surgery | Admitting: General Surgery

## 2015-02-28 ENCOUNTER — Encounter (HOSPITAL_COMMUNITY): Payer: Self-pay | Admitting: General Practice

## 2015-02-28 ENCOUNTER — Ambulatory Visit (HOSPITAL_COMMUNITY): Payer: BLUE CROSS/BLUE SHIELD | Admitting: Anesthesiology

## 2015-02-28 ENCOUNTER — Ambulatory Visit (HOSPITAL_COMMUNITY)
Admission: RE | Admit: 2015-02-28 | Discharge: 2015-02-28 | Disposition: A | Discharge status: Home / Self Care | Payer: BLUE CROSS/BLUE SHIELD | Source: Ambulatory Visit | Attending: General Surgery | Admitting: General Surgery

## 2015-02-28 ENCOUNTER — Encounter (HOSPITAL_COMMUNITY)
Admission: RE | Disposition: A | Discharge status: Home / Self Care | Payer: Self-pay | Source: Ambulatory Visit | Attending: General Surgery

## 2015-02-28 DIAGNOSIS — D0502 Lobular carcinoma in situ of left breast: Secondary | ICD-10-CM | POA: Diagnosis not present

## 2015-02-28 DIAGNOSIS — D0512 Intraductal carcinoma in situ of left breast: Secondary | ICD-10-CM | POA: Diagnosis present

## 2015-02-28 DIAGNOSIS — Z170 Estrogen receptor positive status [ER+]: Secondary | ICD-10-CM | POA: Insufficient documentation

## 2015-02-28 DIAGNOSIS — Z803 Family history of malignant neoplasm of breast: Secondary | ICD-10-CM | POA: Insufficient documentation

## 2015-02-28 HISTORY — PX: BREAST LUMPECTOMY: SHX2

## 2015-02-28 HISTORY — PX: BREAST LUMPECTOMY WITH RADIOACTIVE SEED LOCALIZATION: SHX6424

## 2015-02-28 SURGERY — BREAST LUMPECTOMY WITH RADIOACTIVE SEED LOCALIZATION
Anesthesia: General | Site: Breast | Laterality: Left

## 2015-02-28 MED ORDER — OXYCODONE-ACETAMINOPHEN 5-325 MG PO TABS: 1.0000 | ORAL_TABLET | ORAL | Status: DC | PRN

## 2015-02-28 MED ORDER — FENTANYL CITRATE (PF) 250 MCG/5ML IJ SOLN
INTRAMUSCULAR | Status: AC
  Filled 2015-02-28: qty 5

## 2015-02-28 MED ORDER — BUPIVACAINE-EPINEPHRINE 0.25% -1:200000 IJ SOLN
INTRAMUSCULAR | Status: DC | PRN
  Administered 2015-02-28: 20 mL
  Administered 2015-02-28: 1 mL

## 2015-02-28 MED ORDER — PROPOFOL 10 MG/ML IV BOLUS
INTRAVENOUS | Status: DC | PRN
  Administered 2015-02-28: 180 mg via INTRAVENOUS

## 2015-02-28 MED ORDER — PHENYLEPHRINE HCL 10 MG/ML IJ SOLN
INTRAMUSCULAR | Status: DC | PRN
  Administered 2015-02-28 (×2): 40 ug via INTRAVENOUS

## 2015-02-28 MED ORDER — MIDAZOLAM HCL 5 MG/5ML IJ SOLN
INTRAMUSCULAR | Status: DC | PRN
  Administered 2015-02-28: 2 mg via INTRAVENOUS

## 2015-02-28 MED ORDER — LACTATED RINGERS IV SOLN
INTRAVENOUS | Status: DC
  Administered 2015-02-28 (×2): via INTRAVENOUS

## 2015-02-28 MED ORDER — DEXAMETHASONE SODIUM PHOSPHATE 4 MG/ML IJ SOLN
INTRAMUSCULAR | Status: DC | PRN
  Administered 2015-02-28: 8 mg via INTRAVENOUS

## 2015-02-28 MED ORDER — SCOPOLAMINE 1 MG/3DAYS TD PT72
MEDICATED_PATCH | TRANSDERMAL | Status: AC
  Administered 2015-02-28: 1.5 mg via TRANSDERMAL
  Filled 2015-02-28: qty 1

## 2015-02-28 MED ORDER — HYDROMORPHONE HCL 1 MG/ML IJ SOLN: 0.2500 mg | INTRAMUSCULAR | Status: DC | PRN

## 2015-02-28 MED ORDER — FENTANYL CITRATE (PF) 100 MCG/2ML IJ SOLN
INTRAMUSCULAR | Status: DC | PRN
  Administered 2015-02-28: 50 ug via INTRAVENOUS

## 2015-02-28 MED ORDER — ONDANSETRON HCL 4 MG/2ML IJ SOLN
INTRAMUSCULAR | Status: DC | PRN
  Administered 2015-02-28: 4 mg via INTRAVENOUS

## 2015-02-28 MED ORDER — LIDOCAINE HCL (CARDIAC) 20 MG/ML IV SOLN
INTRAVENOUS | Status: DC | PRN
  Administered 2015-02-28: 70 mg via INTRAVENOUS

## 2015-02-28 MED ORDER — MIDAZOLAM HCL 2 MG/2ML IJ SOLN
INTRAMUSCULAR | Status: AC
  Filled 2015-02-28: qty 4

## 2015-02-28 MED ORDER — EPHEDRINE SULFATE 50 MG/ML IJ SOLN
INTRAMUSCULAR | Status: DC | PRN
  Administered 2015-02-28 (×3): 5 mg via INTRAVENOUS

## 2015-02-28 MED ORDER — PROPOFOL 10 MG/ML IV BOLUS
INTRAVENOUS | Status: AC
  Filled 2015-02-28: qty 20

## 2015-02-28 MED ORDER — ONDANSETRON HCL 4 MG/2ML IJ SOLN: 4.0000 mg | Freq: Once | INTRAMUSCULAR | Status: DC | PRN

## 2015-02-28 MED ORDER — 0.9 % SODIUM CHLORIDE (POUR BTL) OPTIME
TOPICAL | Status: DC | PRN
  Administered 2015-02-28: 1000 mL

## 2015-02-28 MED ORDER — SCOPOLAMINE 1 MG/3DAYS TD PT72
1.0000 | MEDICATED_PATCH | TRANSDERMAL | Status: DC
  Administered 2015-02-28: 1.5 mg via TRANSDERMAL

## 2015-02-28 SURGICAL SUPPLY — 42 items
APPLIER CLIP 9.375 MED OPEN (MISCELLANEOUS) ×2
APR CLP MED 9.3 20 MLT OPN (MISCELLANEOUS) ×1
BINDER BREAST LRG (GAUZE/BANDAGES/DRESSINGS) IMPLANT
BINDER BREAST XLRG (GAUZE/BANDAGES/DRESSINGS) IMPLANT
BLADE SURG 15 STRL LF DISP TIS (BLADE) ×1 IMPLANT
BLADE SURG 15 STRL SS (BLADE) ×2
CANISTER SUCTION 2500CC (MISCELLANEOUS) ×1 IMPLANT
CHLORAPREP W/TINT 26ML (MISCELLANEOUS) ×2 IMPLANT
CLIP APPLIE 9.375 MED OPEN (MISCELLANEOUS) IMPLANT
COVER PROBE W GEL 5X96 (DRAPES) ×2 IMPLANT
COVER SURGICAL LIGHT HANDLE (MISCELLANEOUS) ×2 IMPLANT
DEVICE DUBIN SPECIMEN MAMMOGRA (MISCELLANEOUS) ×2 IMPLANT
DRAPE CHEST BREAST 15X10 FENES (DRAPES) ×2 IMPLANT
DRAPE UTILITY W/TAPE 26X15 (DRAPES) ×2 IMPLANT
ELECT COATED BLADE 2.86 ST (ELECTRODE) ×2 IMPLANT
ELECT REM PT RETURN 9FT ADLT (ELECTROSURGICAL) ×2
ELECTRODE REM PT RTRN 9FT ADLT (ELECTROSURGICAL) ×1 IMPLANT
GLOVE BIO SURGEON STRL SZ7 (GLOVE) ×1 IMPLANT
GLOVE BIO SURGEON STRL SZ7.5 (GLOVE) ×3 IMPLANT
GLOVE BIOGEL PI IND STRL 7.5 (GLOVE) ×1 IMPLANT
GLOVE BIOGEL PI INDICATOR 7.5 (GLOVE) ×1
GLOVE SURG SS PI 7.5 STRL IVOR (GLOVE) ×1 IMPLANT
GOWN STRL REUS W/ TWL LRG LVL3 (GOWN DISPOSABLE) ×2 IMPLANT
GOWN STRL REUS W/TWL LRG LVL3 (GOWN DISPOSABLE) ×4
KIT BASIN OR (CUSTOM PROCEDURE TRAY) ×2 IMPLANT
KIT MARKER MARGIN INK (KITS) ×2 IMPLANT
LIQUID BAND (GAUZE/BANDAGES/DRESSINGS) ×1 IMPLANT
NDL HYPO 25X1 1.5 SAFETY (NEEDLE) ×1 IMPLANT
NEEDLE HYPO 25X1 1.5 SAFETY (NEEDLE) ×2 IMPLANT
NS IRRIG 1000ML POUR BTL (IV SOLUTION) IMPLANT
PACK SURGICAL SETUP 50X90 (CUSTOM PROCEDURE TRAY) ×2 IMPLANT
PENCIL BUTTON HOLSTER BLD 10FT (ELECTRODE) ×2 IMPLANT
SPONGE LAP 18X18 X RAY DECT (DISPOSABLE) ×2 IMPLANT
SUT MNCRL AB 4-0 PS2 18 (SUTURE) ×2 IMPLANT
SUT SILK 2 0 SH (SUTURE) IMPLANT
SUT VIC AB 3-0 SH 18 (SUTURE) ×2 IMPLANT
SYR BULB 3OZ (MISCELLANEOUS) ×2 IMPLANT
SYR CONTROL 10ML LL (SYRINGE) ×2 IMPLANT
TOWEL OR 17X24 6PK STRL BLUE (TOWEL DISPOSABLE) ×2 IMPLANT
TOWEL OR 17X26 10 PK STRL BLUE (TOWEL DISPOSABLE) ×1 IMPLANT
TUBE CONNECTING 12X1/4 (SUCTIONS) ×1 IMPLANT
YANKAUER SUCT BULB TIP NO VENT (SUCTIONS) ×1 IMPLANT

## 2015-02-28 NOTE — Op Note (Signed)
02/28/2015  10:24 AM  PATIENT:  Misty Lamb  59 y.o. female  PRE-OPERATIVE DIAGNOSIS:  LEFT BREAST DUCTAL CARCINOMA IN SITU  POST-OPERATIVE DIAGNOSIS:  LEFT BREAST DUCTAL CARCINOMA IN SITU  PROCEDURE:  Procedure(s): LEFT BREAST LUMPECTOMY WITH RADIOACTIVE SEED LOCALIZATION (Left)  SURGEON:  Surgeon(s) and Role:    * Jovita Kussmaul, MD - Primary  PHYSICIAN ASSISTANT:   ASSISTANTS: none   ANESTHESIA:   general  EBL:  Total I/O In: 800 [I.V.:800] Out: 20 [Blood:20]  BLOOD ADMINISTERED:none  DRAINS: none   LOCAL MEDICATIONS USED:  MARCAINE     SPECIMEN:  Source of Specimen:  left breast tissue  DISPOSITION OF SPECIMEN:  PATHOLOGY  COUNTS:  YES  TOURNIQUET:  * No tourniquets in log *  DICTATION: .Dragon Dictation  After informed consent was obtained the patient was brought to the operating room and placed in the supine position on the operating room table. After adequate induction of general anesthesia the patient's left breast was prepped with ChloraPrep, left dry, and draped in usual sterile manner. Previously an I-125 seed was placed in the upper outer quadrant of the left breast to mark an area of DCIS. The neoprobe was set to I-125 and I was able to readily identify the area of radioactivity. A curvilinear incision was made overlying the area of radioactivity with a 15 blade knife. The incision was carried through the skin and subcutaneous tissue sharply with electrocautery. While checking the area of radioactivity frequently with the neoprobe a circular portion of breast tissue was excised sharply around the radioactive seed. Once the specimen was removed it was oriented with the appropriate paint colors. The I-125 radioactivity was confirmed in the specimen and there was no residual I-125 radioactivity in the left breast. A specimen radiograph was obtained that showed the clip ensued to be in the center of the specimen. The specimen was then sent to pathology for further  evaluation. Hemostasis was achieved using the Bovie electrocautery. The wound was irrigated with saline and infiltrated with quarter percent Marcaine. The lumpectomy cavity was marked with clips. The deep layer of the wound was then closed with layers of interrupted 3-0 Vicryl stitches. The skin was then closed with interrupted 4-0 Monocryl subcuticular stitches. Dermabond dressings were applied. Patient tolerated the procedure well. At the end of the case all needle sponge and instrument counts were correct. The patient was then awakened and taken to recovery in stable condition.  PLAN OF CARE: Discharge to home after PACU  PATIENT DISPOSITION:  PACU - hemodynamically stable.   Delay start of Pharmacological VTE agent (>24hrs) due to surgical blood loss or risk of bleeding: not applicable

## 2015-02-28 NOTE — Telephone Encounter (Signed)
Bra order faxed to [facility].  Sent to scan.

## 2015-02-28 NOTE — Anesthesia Preprocedure Evaluation (Addendum)
Anesthesia Evaluation  Patient identified by MRN, date of birth, ID band Patient awake    Reviewed: Allergy & Precautions, NPO status , Patient's Chart, lab work & pertinent test results  Airway Mallampati: II  TM Distance: >3 FB Neck ROM: Full    Dental  (+) Teeth Intact, Dental Advisory Given   Pulmonary  breath sounds clear to auscultation        Cardiovascular Rhythm:Regular Rate:Normal     Neuro/Psych    GI/Hepatic   Endo/Other    Renal/GU      Musculoskeletal   Abdominal   Peds  Hematology   Anesthesia Other Findings   Reproductive/Obstetrics                             Anesthesia Physical Anesthesia Plan  ASA: II  Anesthesia Plan: General   Post-op Pain Management:    Induction: Intravenous  Airway Management Planned: LMA  Additional Equipment:   Intra-op Plan:   Post-operative Plan: Extubation in OR  Informed Consent: I have reviewed the patients History and Physical, chart, labs and discussed the procedure including the risks, benefits and alternatives for the proposed anesthesia with the patient or authorized representative who has indicated his/her understanding and acceptance.   Dental advisory given  Plan Discussed with: CRNA and Anesthesiologist  Anesthesia Plan Comments: (H/O post-op N/V  Plan GA with LMA with scopolamine patch, zofran, decadron  Roberts Gaudy)       Anesthesia Quick Evaluation

## 2015-02-28 NOTE — Interval H&P Note (Signed)
History and Physical Interval Note:  02/28/2015 7:24 AM  Misty Lamb  has presented today for surgery, with the diagnosis of LEFT BREAST DUCTAL CARCINOMA INSITU  The various methods of treatment have been discussed with the patient and family. After consideration of risks, benefits and other options for treatment, the patient has consented to  Procedure(s): LEFT BREAST LUMPECTOMY WITH RADIOACTIVE SEED LOCALIZATION (Left) as a surgical intervention .  The patient's history has been reviewed, patient examined, no change in status, stable for surgery.  I have reviewed the patient's chart and labs.  Questions were answered to the patient's satisfaction.     TOTH III,Dawan Farney S

## 2015-02-28 NOTE — Transfer of Care (Signed)
Immediate Anesthesia Transfer of Care Note  Patient: Misty Lamb  Procedure(s) Performed: Procedure(s): LEFT BREAST LUMPECTOMY WITH RADIOACTIVE SEED LOCALIZATION (Left)  Patient Location: PACU  Anesthesia Type:General  Level of Consciousness: awake and alert   Airway & Oxygen Therapy: Patient Spontanous Breathing and Patient connected to nasal cannula oxygen  Post-op Assessment: Report given to RN, Post -op Vital signs reviewed and stable and Patient moving all extremities  Post vital signs: Reviewed and stable  Last Vitals:  Filed Vitals:   02/28/15 0748  BP: 148/65  Pulse: 73  Temp: 36.1 C  Resp: 18    Complications: No apparent anesthesia complications

## 2015-02-28 NOTE — H&P (Signed)
Misty Lamb 02/06/2015 8:30 AM Location: [facility] Patient #: [id number] DOB: 06-Jan-1956 Undefined / Language: Cleophus Molt / Race: White Female  History of Present Illness Sammuel Hines. Marlou Starks MD; 02/06/2015 2:58 PM) The patient is a 59 year old female who presents with breast cancer. We're asked to see the patient in consultation by Dr. Luberta Robertson to evaluate her for DCIS of the left breast. The patient is a 59 year old white female who has been getting 6 month mammograms to follow an area of abnormality. On her most recent mammogram she was found to have a new abnormal area of calcification in the upper outer quadrant of the left breast. This measured 1.5 cm. It was biopsied and came back as low-grade DCIS. It was ER and PR positive. She does have a history of breast cancer in her mother at the age of 76. She does not take any hormone replacement.   Other Problems Patsey Berthold, CMA; 02/06/2015 8:30 AM) Breast Cancer Lump In Breast Other disease, cancer, significant illness  Past Surgical History Patsey Berthold, CMA; 02/06/2015 8:30 AM) Appendectomy Breast Biopsy Left. Oral Surgery  Diagnostic Studies History Patsey Berthold, [place]; 02/06/2015 8:30 AM) Colonoscopy 1-5 years ago Mammogram within last year Pap Smear 1-5 years ago  Social History Patsey Berthold, [place]; 02/06/2015 8:30 AM) Caffeine use Coffee. No alcohol use No drug use Tobacco use Never smoker.  Family History Patsey Berthold, [place]; 02/06/2015 8:30 AM) Alcohol Abuse Family Members In General. Anesthetic complications Mother. Arthritis Family Members In General. Bleeding disorder Family Members In General. Breast Cancer Mother. Cancer Family Members In General, Mother. Cerebrovascular Accident Father. Depression Mother. Diabetes Mellitus Family Members In General. Heart Disease Family Members In General. Hypertension Brother, Family Members In General, Father, Mother.  Pregnancy / Birth History  Patsey Berthold, [place]; 02/06/2015 8:30 AM) Age at menarche 67 years. Age of menopause 39-62 Contraceptive History Oral contraceptives. Gravida 3 Irregular periods Maternal age 48-25 Para 3  Review of Systems Patsey Berthold CMA; 02/06/2015 8:30 AM) General Present- Night Sweats. Not Present- Appetite Loss, Chills, Fatigue, Fever, Weight Gain and Weight Loss. Skin Not Present- Change in Wart/Mole, Dryness, Hives, Jaundice, New Lesions, Non-Healing Wounds, Rash and Ulcer. HEENT Present- Wears glasses/contact lenses. Not Present- Earache, Hearing Loss, Hoarseness, Nose Bleed, Oral Ulcers, Ringing in the Ears, Seasonal Allergies, Sinus Pain, Sore Throat, Visual Disturbances and Yellow Eyes. Respiratory Present- Snoring. Not Present- Bloody sputum, Chronic Cough, Difficulty Breathing and Wheezing. Breast Present- Breast Pain. Not Present- Breast Mass, Nipple Discharge and Skin Changes. Cardiovascular Not Present- Chest Pain, Difficulty Breathing Lying Down, Leg Cramps, Palpitations, Rapid Heart Rate, Shortness of Breath and Swelling of Extremities. Gastrointestinal Not Present- Abdominal Pain, Bloating, Bloody Stool, Change in Bowel Habits, Chronic diarrhea, Constipation, Difficulty Swallowing, Excessive gas, Gets full quickly at meals, Hemorrhoids, Indigestion, Nausea, Rectal Pain and Vomiting. Female Genitourinary Not Present- Frequency, Nocturia, Painful Urination, Pelvic Pain and Urgency. Musculoskeletal Not Present- Back Pain, Joint Pain, Joint Stiffness, Muscle Pain, Muscle Weakness and Swelling of Extremities. Neurological Not Present- Decreased Memory, Fainting, Headaches, Numbness, Seizures, Tingling, Tremor, Trouble walking and Weakness. Psychiatric Not Present- Anxiety, Bipolar, Change in Sleep Pattern, Depression, Fearful and Frequent crying. Endocrine Present- Hot flashes. Not Present- Cold Intolerance, Excessive Hunger, Hair Changes, Heat Intolerance and New Diabetes.   Physical Exam  Eddie Dibbles S. Marlou Starks MD; 02/06/2015 2:59 PM) General Mental Status-Alert. General Appearance-Consistent with stated age. Hydration-Well hydrated. Voice-Normal.  Head and Neck Head-normocephalic, atraumatic with no lesions or palpable masses. Trachea-midline. Thyroid Gland Characteristics - normal  size and consistency.  Eye Eyeball - Bilateral-Extraocular movements intact. Sclera/Conjunctiva - Bilateral-No scleral icterus.  Chest and Lung Exam Chest and lung exam reveals -quiet, even and easy respiratory effort with no use of accessory muscles and on auscultation, normal breath sounds, no adventitious sounds and normal vocal resonance. Inspection Chest Wall - Normal. Back - normal.  Breast Note: There is no palpable mass in either breast. There is no palpable axillary, supraclavicular, or cervical lymphadenopathy.   Cardiovascular Cardiovascular examination reveals -normal heart sounds, regular rate and rhythm with no murmurs and normal pedal pulses bilaterally.  Abdomen Inspection Inspection of the abdomen reveals - No Hernias. Skin - Scar - no surgical scars. Palpation/Percussion Palpation and Percussion of the abdomen reveal - Soft, Non Tender, No Rebound tenderness, No Rigidity (guarding) and No hepatosplenomegaly. Auscultation Auscultation of the abdomen reveals - Bowel sounds normal.  Neurologic Neurologic evaluation reveals -alert and oriented x 3 with no impairment of recent or remote memory. Mental Status-Normal.  Musculoskeletal Normal Exam - Left-Upper Extremity Strength Normal and Lower Extremity Strength Normal. Normal Exam - Right-Upper Extremity Strength Normal and Lower Extremity Strength Normal.  Lymphatic Head & Neck  General Head & Neck Lymphatics: Bilateral - Description - Normal. Axillary  General Axillary Region: Bilateral - Description - Normal. Tenderness - Non Tender. Femoral & Inguinal  Generalized Femoral & Inguinal  Lymphatics: Bilateral - Description - Normal. Tenderness - Non Tender.    Assessment & Plan Eddie Dibbles S. Marlou Starks MD; 02/06/2015 3:00 PM) DCIS (DUCTAL CARCINOMA IN SITU), LEFT (233.0  D05.12) Impression: The patient appears to have a small area of low-grade DCIS in the upper outer quadrant of the left breast. I have talked her in detail about the different options for treatment and at this point she favors breast conservation. I think this is a very reasonable way of treating her cancer. She will require localization of the area since it is not palpable. Since it is small and low-grade she will not require evaluation of the lymph nodes either. I will plan for a radioactive seed localized left breast lumpectomy. I have discussed with her in detail the risks and benefits of the operation as well as some of the technical aspects and she understands and wishes to proceed Current Plans  Pt Education - Breast Cancer: discussed with patient and provided information.   Signed by Luella Cook, MD (02/06/2015 3:01 PM)

## 2015-02-28 NOTE — Anesthesia Procedure Notes (Signed)
Procedure Name: LMA Insertion Date/Time: 02/28/2015 9:38 AM Performed by: Suzy Bouchard Pre-anesthesia Checklist: Patient identified, Timeout performed, Emergency Drugs available, Suction available and Patient being monitored Patient Re-evaluated:Patient Re-evaluated prior to inductionOxygen Delivery Method: Circle system utilized Preoxygenation: Pre-oxygenation with 100% oxygen Intubation Type: IV induction Ventilation: Mask ventilation without difficulty LMA: LMA inserted LMA Size: 4.0 Number of attempts: 1 Tube secured with: Tape Dental Injury: Teeth and Oropharynx as per pre-operative assessment

## 2015-02-28 NOTE — Anesthesia Postprocedure Evaluation (Signed)
  Anesthesia Post-op Note  Patient: Misty Lamb  Procedure(s) Performed: Procedure(s): LEFT BREAST LUMPECTOMY WITH RADIOACTIVE SEED LOCALIZATION (Left)  Patient Location: PACU  Anesthesia Type:General  Level of Consciousness: awake, alert  and oriented  Airway and Oxygen Therapy: Patient Spontanous Breathing and Patient connected to nasal cannula oxygen  Post-op Pain: none  Post-op Assessment: Post-op Vital signs reviewed, Patient's Cardiovascular Status Stable, Respiratory Function Stable, Patent Airway, No signs of Nausea or vomiting and Pain level controlled              Post-op Vital Signs: stable  Last Vitals:  Filed Vitals:   02/28/15 1100  BP: 139/62  Pulse: 90  Temp:   Resp: 18    Complications: No apparent anesthesia complications

## 2015-03-01 ENCOUNTER — Encounter (HOSPITAL_COMMUNITY): Payer: Self-pay | Admitting: General Surgery

## 2015-03-07 ENCOUNTER — Telehealth: Payer: Self-pay | Admitting: Hematology and Oncology

## 2015-03-07 NOTE — Telephone Encounter (Signed)
Patient called in to move her appointment °

## 2015-03-12 NOTE — Progress Notes (Signed)
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 - CALCIFICATIONS PRESENT. - PREVIOUS BIOPSY SITE PRESENT.  01/2815 Diagnosis Breast, left, needle core biopsy, upper outer quadrant - DUCTAL CARCINOMA IN SITU WITH CALCIFICATIONS. - FIBROADENOMA. - SEE COMMENT. 1 of  Receptor Status: ER(100%), PR (10%), Her2-neu ()  Misty Lamb: at a six-month follow-up mammogram for what was found to represent a fibroadenoma, she was noted to have a 3 x 3 x 4 mm area of suspicious calcifications within the upper-outer quadrant. There were a few calcifications posterior to this tight group of calcifications; altogether the calcifications including the dominant group measured 1.0 x 1.1 x 1.5 cm. Ultrasound did not show any change in a mass at 3:00 measuring 7 x 6 x 4 mm felt to represent a fibroadenoma. A biopsy of the calcifications on 01/31/2015 revealed low-grade DCIS with calcifications and also "fibroadenoma". The DCIS was ER/PR positive.  Past/Anticipated interventions by surgeon, if any: Dr. Autumn Messing: Lumpectomy Left Breast  Past/Anticipated interventions by medical oncology, if any: Chemotherapy : Dr.Vinay Gudena:Anti-estrogen therapy following Radiation therapy  Lymphedema issues, if any:  No   Pain issues, if any: No  SAFETY ISSUES:  Prior radiation? No  Pacemaker/ICD? No  Possible current pregnancy? N0  Is the patient on methotrexate? No  Current Complaints / other details:    She menarched at early age of 63 and went to menopause at age 47  She had 3 pregnancy, her first child was born at age 30  She has received birth control pills for approximately 2 years  Hormone replacement therapy x 1 month  She has family history of Breast/GYN/GI cancer Mother age 15 with breast cancer      [name], Crista Curb, RN 03/12/2015,7:32 PM

## 2015-03-14 ENCOUNTER — Encounter: Payer: Self-pay | Admitting: Radiation Oncology

## 2015-03-14 ENCOUNTER — Ambulatory Visit
Admission: RE | Admit: 2015-03-14 | Discharge: 2015-03-14 | Disposition: A | Discharge status: Home / Self Care | Payer: BLUE CROSS/BLUE SHIELD | Source: Ambulatory Visit | Attending: Radiation Oncology | Admitting: Radiation Oncology

## 2015-03-14 VITALS — BP 129/69 | HR 66 | Temp 97.8°F | Ht 66.0 in | Wt 156.6 lb

## 2015-03-14 DIAGNOSIS — Z510 Encounter for antineoplastic radiation therapy: Secondary | ICD-10-CM | POA: Insufficient documentation

## 2015-03-14 DIAGNOSIS — Z170 Estrogen receptor positive status [ER+]: Secondary | ICD-10-CM | POA: Diagnosis not present

## 2015-03-14 DIAGNOSIS — C50912 Malignant neoplasm of unspecified site of left female breast: Secondary | ICD-10-CM | POA: Diagnosis present

## 2015-03-14 DIAGNOSIS — C50412 Malignant neoplasm of upper-outer quadrant of left female breast: Secondary | ICD-10-CM

## 2015-03-14 NOTE — Addendum Note (Signed)
Encounter addended by: Arloa Koh, MD on: 03/14/2015  7:54 PM<BR>     Documentation filed: Dx Association, Flowsheet VN, Orders

## 2015-03-14 NOTE — Progress Notes (Signed)
CC: Dr. Harlan Stains, Dr. Autumn Messing III  Diagnosis: Pathologic stage 0 (Tis N0 M0) DCIS of the left breast  History: Misty Lamb returns today for review and scheduling of her radiation therapy in the management of her DCIS of the left breast.  I first saw the patient in consultation at the multidisciplinary breast clinic on 02/06/2015. At the time of a six-month follow-up mammogram for what was found to represent a fibroadenoma, she was noted to have a 3 x 3 x 4 mm area of suspicious calcifications within the upper-outer quadrant. There were a few calcifications posterior to this tight group of calcifications; altogether the calcifications including the dominant group measured 1.0 x 1.1 x 1.5 cm. Ultrasound did not show any change in a mass at 3:00 measuring 7 x 6 x 4 mm felt to represent a fibroadenoma. A biopsy of the calcifications on 01/31/2015 revealed low-grade DCIS with calcifications and also "fibroadenoma". The DCIS was ER/PR positive.  On 02/28/2015 showed underwent a left partial mastectomy.  There was no residual DCIS but there was lobular neoplasia/LCIS along with calcifications.  The previous biopsy site was present.  She is without complaints today.  Physical examination: Alert and oriented. Filed Vitals:   03/14/15 1050  BP: 129/69  Pulse: 66  Temp: 97.8 F (36.6 C)   Nodes: There is no palpable cervical, supraclavicular, or axillary lymphadenopathy.  There is a partial mastectomy wound along the upper outer quadrant of left breast extending from one to 3:00.  The wound is healing well.  No masses are appreciated.  Extremities: Without edema.  Impression: Stage 0 (Tis N0 M0) DCIS of the left breast.  We again discussed the role of radiation therapy for DCIS.  We discussed observation, adjuvant radiation therapy, and adjuvant antiestrogen therapy.  In view of her age, I feel that she should receive adjuvant radiation therapy along with antiestrogen therapy.  I explained her that  antiestrogen therapy would probably decrease her risk for future development of a left or right sided breast cancer by approximately 50%, particularly with her diagnosis of LCIS and already having DCIS.  She'll discuss this with Dr. Lindi Adie.  I told her that she would be a good candidate for hypofractionated treatment over 3 weeks, but she is somewhat hesitant to receive hypofractionated treatment when standard fractionation has been used for approximately 30 years.  I told her that I felt there was no difference in local control, and certainly no difference in overall survival.  She will think things over.  I would like to have her undergo mammography to confirm removal of all suspicious microcalcifications.  This will be done at the [facility] the week of September 25.  I will have her return for CT simulation the week of October 3.  Consent is signed today.  Plan: As above.  30 minutes was spent face-to-face the patient, primarily counseling patient and coordinating her care.

## 2015-03-15 ENCOUNTER — Telehealth: Payer: Self-pay | Admitting: *Deleted

## 2015-03-15 NOTE — Telephone Encounter (Signed)
Called patient to inform of mammogram on 04-02-15 - arrival time - 10:30 am @ [facility], line busy will call later.

## 2015-03-18 ENCOUNTER — Ambulatory Visit: Payer: BLUE CROSS/BLUE SHIELD | Admitting: Hematology and Oncology

## 2015-03-22 ENCOUNTER — Telehealth: Payer: Self-pay | Admitting: Hematology and Oncology

## 2015-03-22 NOTE — Telephone Encounter (Signed)
returned call and s.w pt and r/s appt....pt ok and aware °

## 2015-03-25 ENCOUNTER — Telehealth: Payer: Self-pay | Admitting: *Deleted

## 2015-03-25 ENCOUNTER — Ambulatory Visit: Payer: BLUE CROSS/BLUE SHIELD | Admitting: Hematology and Oncology

## 2015-03-25 NOTE — Telephone Encounter (Signed)
Left vm for pt to return call to assess needs after surgery. Contact information given.

## 2015-04-02 ENCOUNTER — Ambulatory Visit
Admission: RE | Admit: 2015-04-02 | Discharge: 2015-04-02 | Disposition: A | Discharge status: Home / Self Care | Payer: BLUE CROSS/BLUE SHIELD | Source: Ambulatory Visit | Attending: Radiation Oncology | Admitting: Radiation Oncology

## 2015-04-02 DIAGNOSIS — C50412 Malignant neoplasm of upper-outer quadrant of left female breast: Secondary | ICD-10-CM

## 2015-04-08 ENCOUNTER — Encounter: Payer: Self-pay | Admitting: Hematology and Oncology

## 2015-04-08 ENCOUNTER — Ambulatory Visit
Admission: RE | Admit: 2015-04-08 | Discharge: 2015-04-08 | Disposition: A | Discharge status: Home / Self Care | Payer: BLUE CROSS/BLUE SHIELD | Source: Ambulatory Visit | Attending: Radiation Oncology | Admitting: Radiation Oncology

## 2015-04-08 ENCOUNTER — Ambulatory Visit (HOSPITAL_BASED_OUTPATIENT_CLINIC_OR_DEPARTMENT_OTHER): Payer: BLUE CROSS/BLUE SHIELD | Admitting: Hematology and Oncology

## 2015-04-08 VITALS — BP 139/64 | HR 72 | Temp 98.2°F | Resp 18 | Ht 66.0 in | Wt 158.2 lb

## 2015-04-08 DIAGNOSIS — Z170 Estrogen receptor positive status [ER+]: Secondary | ICD-10-CM

## 2015-04-08 DIAGNOSIS — C50412 Malignant neoplasm of upper-outer quadrant of left female breast: Secondary | ICD-10-CM

## 2015-04-08 DIAGNOSIS — D0512 Intraductal carcinoma in situ of left breast: Secondary | ICD-10-CM

## 2015-04-08 NOTE — Progress Notes (Signed)
Patient Care Team: Harlan Stains, MD as PCP - General (Family Medicine) Kennith Center, RD as Dietitian (Family Medicine) Arloa Koh, MD as Consulting Physician (Radiation Oncology) Mauro Kaufmann, RN as Registered Nurse Rockwell Germany, RN as Registered Nurse Autumn Messing III, MD as Consulting Physician (General Surgery) Nicholas Lose, MD as Consulting Physician (Hematology and Oncology) Sylvan Cheese, NP as Nurse Practitioner (Nurse Practitioner)  DIAGNOSIS: Breast cancer of upper-outer quadrant of left female breast [facility])   Staging form: Breast, AJCC 7th Edition     Clinical stage from 02/06/2015: Stage 0 (Tis (DCIS), N0, M0) - Unsigned   SUMMARY OF ONCOLOGIC HISTORY:   Breast cancer of upper-outer quadrant of left female breast ([place])   01/31/2015 Mammogram Six-month reevaluation of left breast calcifications showed increase in calcifications 15 mm span; fibroadenoma measuring 7 mm the 3:00 position stable   01/31/2015 Initial Diagnosis Left breast biopsy: DCIS with calcifications, fibroadenoma, low-grade, ER 100%, PR 10%   02/27/2015 Surgery A LH and LCIS    CHIEF COMPLIANT: Follow-up of surgery.  INTERVAL HISTORY: Misty Lamb is a 59 year old lady with above-mentioned history of left breast DCIS on initial biopsy came back after having had a lumpectomy. She has recovered very well from surgery. She reports no major problems or concerns. She is very confused about whether or not to receive radiation therapy or not. She had made up her mind to not receive antiestrogen therapy for sure.  REVIEW OF SYSTEMS:   Constitutional: Denies fevers, chills or abnormal weight loss Eyes: Denies blurriness of vision Ears, nose, mouth, throat, and face: Denies mucositis or sore throat Respiratory: Denies cough, dyspnea or wheezes Cardiovascular: Denies palpitation, chest discomfort or lower extremity swelling Gastrointestinal:  Denies nausea, heartburn or change in bowel habits Skin: Denies  abnormal skin rashes Lymphatics: Denies new lymphadenopathy or easy bruising Neurological:Denies numbness, tingling or new weaknesses Behavioral/Psych: Mood is stable, no new changes  Breast:  denies any pain or lumps or nodules in either breasts All other systems were reviewed with the patient and are negative.  I have reviewed the past medical history, past surgical history, social history and family history with the patient and they are unchanged from previous note.  ALLERGIES:  has No Known Allergies.  MEDICATIONS:  Current Outpatient Prescriptions  Medication Sig Dispense Refill  . Multiple Vitamins-Minerals (MULTIVITAMIN ADULT PO) Take 1 tablet by mouth daily.     Marland Kitchen oxyCODONE-acetaminophen (PERCOCET/ROXICET) 5-325 MG tablet   0   No current facility-administered medications for this visit.    PHYSICAL EXAMINATION: ECOG PERFORMANCE STATUS: 0 - Asymptomatic  Filed Vitals:   04/08/15 1528  BP: 139/64  Pulse: 72  Temp: 98.2 F (36.8 C)  Resp: 18   Filed Weights   04/08/15 1528  Weight: 158 lb 3.2 oz (71.759 kg)    GENERAL:alert, no distress and comfortable SKIN: skin color, texture, turgor are normal, no rashes or significant lesions EYES: normal, Conjunctiva are pink and non-injected, sclera clear OROPHARYNX:no exudate, no erythema and lips, buccal mucosa, and tongue normal  NECK: supple, thyroid normal size, non-tender, without nodularity LYMPH:  no palpable lymphadenopathy in the cervical, axillary or inguinal LUNGS: clear to auscultation and percussion with normal breathing effort HEART: regular rate & rhythm and no murmurs and no lower extremity edema ABDOMEN:abdomen soft, non-tender and normal bowel sounds Musculoskeletal:no cyanosis of digits and no clubbing  NEURO: alert & oriented x 3 with fluent speech, no focal motor/sensory deficits  LABORATORY DATA:  I have reviewed the  data as listed   Chemistry      Component Value Date/Time   NA 141 02/20/2015  1051   NA 142 02/06/2015 1215   K 3.6 02/20/2015 1051   K 4.0 02/06/2015 1215   CL 105 02/20/2015 1051   CO2 28 02/20/2015 1051   CO2 27 02/06/2015 1215   BUN 10 02/20/2015 1051   BUN 19.2 02/06/2015 1215   CREATININE 0.58 02/20/2015 1051   CREATININE 0.7 02/06/2015 1215      Component Value Date/Time   CALCIUM 9.2 02/20/2015 1051   CALCIUM 9.3 02/06/2015 1215   ALKPHOS 73 02/06/2015 1215   AST 17 02/06/2015 1215   ALT 19 02/06/2015 1215   BILITOT 0.69 02/06/2015 1215       Lab Results  Component Value Date   WBC 6.7 02/20/2015   HGB 14.7 02/20/2015   HCT 43.9 02/20/2015   MCV 88.5 02/20/2015   PLT 260 02/20/2015   NEUTROABS 6.7* 02/06/2015   ASSESSMENT & PLAN:  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 Also the difference between [facility] and LCIS. I explained to the patient that [facility] and LCIS of considered to be risk factors for breast cancer. They are not precancerous lesions.  Prognosis: Based upon M.D. Anderson nomogram for DCIS, the risk of recurrence of breast cancer without anything further is a 11% at 10 years. With hormonal therapy and radiation the risk would be down to 3%. With radiation along the risk would be down to 5-6%.  After listening to all the risks and benefits of both radiation and hormone therapy, patient decided to not receive either of these treatments. She would like to remain on surveillance. I will see her back in 6 months to do a breast exam and follow-up.    No orders of the defined types were placed in this encounter.   The patient has a good understanding of the overall plan. she agrees with it. she will call with any problems that may develop before the next visit here.   Rulon Eisenmenger, MD

## 2015-04-08 NOTE — Progress Notes (Signed)
Complex simulation/treatment planning note: The patient was taken to the CT simulator.  An alpha cradle was constructed for immobilization.  Her left breast borders were marked with radiopaque wires along with her partial mastectomy scar and nipple.  She was scanned free breathing.  It was determined that the cardiac silhouette would be within tangential fields, and thus she was rescanned with deep inspiration breath-hold.  There was significant movement of the cardiac silhouette away from her tangential fields.  The CT data set was sent to the planning system where contoured her tumor bed.  Dosimetry contoured her remaining normal structures including the heart and lungs.  She was set up to medial and lateral left breast tangents with 2 unique MLCs.  I'm prescribing 4250 cGy 17 sessions utilizing 6 MV photons.  No boost.

## 2015-04-08 NOTE — Assessment & Plan Note (Signed)
Left breast biopsy: DCIS with calcifications, fibroadenoma, low-grade, ER 100%, PR 10% Left lumpectomy 02/27/2015: LCIS with atypical lobular hyperplasia  Pathology counseling: I discussed with the patient the difference between LCIS and DCIS. Also the difference between [facility] and LCIS. I explained to the patient that [facility] and LCIS of considered to be risk factors for breast cancer. They are not precancerous lesions.  Prognosis: Based upon M.D. Anderson nomogram for DCIS, the risk of recurrence of breast cancer without anything further is a 11% at 10 years. With hormonal therapy and radiation the risk would be down to 3%. With radiation along the risk would be down to 5-6%.  After listening to all the risks and benefits of both radiation and hormone therapy, patient decided to not receive either of these treatments. She would like to remain on surveillance. I will see her back in 6 months to do a breast exam and follow-up.

## 2015-04-08 NOTE — Progress Notes (Signed)
Name: Misty Lamb   MRN: [id number]  Date:  04/08/2015  DOB: 07-12-1955  Status:outpatient   DIAGNOSIS: Left Breast cancer.  CONSENT VERIFIED: yes SET UP: Patient is setup supine  IMMOBILIZATION:  The following immobilization was used:Custom Moldable Pillow, breast board.  NARRATIVE: [name] was brought to the [place].  Identity was confirmed.  All relevant records and images related to the planned course of therapy were reviewed.  Then, the patient was positioned in a stable reproducible clinical set-up for radiation therapy.  Wires were placed to delineate the clinical extent of breast tissue. A wire was placed on the scar as well.  CT images were obtained.  An isocenter was placed. Skin markings were placed.  The position of the heart was then analyzed.  Due to the proximity of the heart to the chest wall, I felt she would benefit from deep inspiration breath hold for cardiac sparing.  She was then coached and rescanned in the breath hold position.  Acceptable cardiac sparing was achieved. The CT images were loaded into the planning software where the target and avoidance structures were contoured.  The radiation prescription was entered and confirmed. The patient was discharged in stable condition and tolerated simulation well.    TREATMENT PLANNING NOTE/3D Simulation Note Treatment planning then occurred. I have requested : MLC's, isodose plan, basic dose calculation  3D simulation was performed.  I personally designed and supervised the construction of 3 medically necessary complex treatment devices in the form of MLCs which will be used for beam modification and to protect critical structures including the heart and lung as well as the immobilization device which is necessary for reproducible set up.  I have requested a dose volume histogram of the heart, lung and tumor cavity.    RESPIRATORY MOTION MANAGEMENT SIMULATION - Deep Inspiration Breath Hold  NARRATIVE:   In order to account for effect of respiratory motion on target structures and other organs in the planning and delivery of radiotherapy, this patient underwent respiratory motion management simulation.  To accomplish this, when the patient was brought to the CT simulation planning suite, a bellows was placed on the her abdomen.  Wave forms of the patient's breathing were obtained. Coaching was performed and practice sessions initiated to monitor her ability to obtain and maintain deep inspiration breath hold.  The CT images were loaded into the planning software and fused with her free breathing images by physics.  Acceptable cardiac sparing was achieved through the use of deep inspiration breath hold.  Planning will be performed on her breath hold scan

## 2015-04-09 ENCOUNTER — Telehealth: Payer: Self-pay | Admitting: Hematology and Oncology

## 2015-04-09 NOTE — Telephone Encounter (Signed)
no vm...mailed pt appt sched/avs and letter for APril 2017

## 2015-04-15 ENCOUNTER — Ambulatory Visit: Payer: BLUE CROSS/BLUE SHIELD | Admitting: Radiation Oncology

## 2015-04-15 ENCOUNTER — Ambulatory Visit: Payer: BLUE CROSS/BLUE SHIELD

## 2015-04-15 ENCOUNTER — Encounter: Payer: Self-pay | Admitting: Radiation Oncology

## 2015-04-15 NOTE — Progress Notes (Signed)
Chart note: Misty Lamb call me back today to tell me that she is not interested in ration therapy or antiestrogen therapy. She knows to maintain her follow-up and get her mammograms as instructed.

## 2015-04-16 ENCOUNTER — Ambulatory Visit: Payer: BLUE CROSS/BLUE SHIELD

## 2015-04-17 ENCOUNTER — Ambulatory Visit: Payer: BLUE CROSS/BLUE SHIELD

## 2015-04-18 ENCOUNTER — Ambulatory Visit: Payer: BLUE CROSS/BLUE SHIELD

## 2015-04-18 ENCOUNTER — Other Ambulatory Visit: Payer: Self-pay | Admitting: *Deleted

## 2015-04-18 DIAGNOSIS — C50412 Malignant neoplasm of upper-outer quadrant of left female breast: Secondary | ICD-10-CM

## 2015-04-19 ENCOUNTER — Telehealth: Payer: Self-pay | Admitting: Hematology and Oncology

## 2015-04-19 ENCOUNTER — Ambulatory Visit: Payer: BLUE CROSS/BLUE SHIELD

## 2015-04-19 NOTE — Telephone Encounter (Signed)
Appointments made and survivorship made and program pamphlet mailed

## 2015-04-22 ENCOUNTER — Ambulatory Visit
Admission: RE | Admit: 2015-04-22 | Discharge: 2015-04-22 | Disposition: A | Discharge status: Home / Self Care | Payer: BLUE CROSS/BLUE SHIELD | Source: Ambulatory Visit | Attending: Radiation Oncology | Admitting: Radiation Oncology

## 2015-04-22 ENCOUNTER — Ambulatory Visit: Payer: BLUE CROSS/BLUE SHIELD

## 2015-04-23 ENCOUNTER — Ambulatory Visit: Payer: BLUE CROSS/BLUE SHIELD

## 2015-04-24 ENCOUNTER — Ambulatory Visit: Payer: BLUE CROSS/BLUE SHIELD

## 2015-04-25 ENCOUNTER — Ambulatory Visit: Payer: BLUE CROSS/BLUE SHIELD

## 2015-04-26 ENCOUNTER — Ambulatory Visit: Payer: BLUE CROSS/BLUE SHIELD

## 2015-04-29 ENCOUNTER — Ambulatory Visit: Payer: BLUE CROSS/BLUE SHIELD

## 2015-04-30 ENCOUNTER — Ambulatory Visit: Payer: BLUE CROSS/BLUE SHIELD

## 2015-05-01 ENCOUNTER — Ambulatory Visit: Payer: BLUE CROSS/BLUE SHIELD

## 2015-05-02 ENCOUNTER — Ambulatory Visit: Payer: BLUE CROSS/BLUE SHIELD

## 2015-05-03 ENCOUNTER — Ambulatory Visit: Payer: BLUE CROSS/BLUE SHIELD

## 2015-05-06 ENCOUNTER — Ambulatory Visit: Payer: BLUE CROSS/BLUE SHIELD

## 2015-05-06 ENCOUNTER — Ambulatory Visit: Payer: BLUE CROSS/BLUE SHIELD | Admitting: Radiation Oncology

## 2015-05-07 ENCOUNTER — Ambulatory Visit: Payer: BLUE CROSS/BLUE SHIELD

## 2015-05-08 ENCOUNTER — Ambulatory Visit: Payer: BLUE CROSS/BLUE SHIELD

## 2015-05-09 ENCOUNTER — Encounter: Payer: BLUE CROSS/BLUE SHIELD | Admitting: Nurse Practitioner

## 2015-05-09 ENCOUNTER — Telehealth: Payer: Self-pay | Admitting: Hematology and Oncology

## 2015-05-09 ENCOUNTER — Ambulatory Visit: Payer: BLUE CROSS/BLUE SHIELD

## 2015-05-09 NOTE — Telephone Encounter (Signed)
returned call and lvm for pt to call back to r/s missed appt °

## 2015-05-10 ENCOUNTER — Ambulatory Visit: Payer: BLUE CROSS/BLUE SHIELD

## 2015-05-13 ENCOUNTER — Ambulatory Visit: Payer: BLUE CROSS/BLUE SHIELD

## 2015-05-14 ENCOUNTER — Ambulatory Visit: Payer: BLUE CROSS/BLUE SHIELD

## 2015-05-15 ENCOUNTER — Ambulatory Visit: Payer: BLUE CROSS/BLUE SHIELD

## 2015-05-16 ENCOUNTER — Ambulatory Visit: Payer: BLUE CROSS/BLUE SHIELD

## 2015-05-17 ENCOUNTER — Ambulatory Visit: Payer: BLUE CROSS/BLUE SHIELD

## 2015-05-20 ENCOUNTER — Ambulatory Visit: Payer: BLUE CROSS/BLUE SHIELD

## 2015-05-24 ENCOUNTER — Encounter: Payer: Self-pay | Admitting: Nurse Practitioner

## 2015-05-24 DIAGNOSIS — C50412 Malignant neoplasm of upper-outer quadrant of left female breast: Secondary | ICD-10-CM

## 2015-05-24 NOTE — Progress Notes (Signed)
The Survivorship Care Plan was mailed to Ms. Siler as she reported not being able to come in to the Survivorship Clinic for an in-person visit at this time. A letter was mailed to her outlining the purpose of the content of the care plan, as well as encouraging her to reach out to me with any questions or concerns.  My business card was included in the correspondence to the patient as well.  A copy of the care plan was also routed/faxed/mailed to Vidal Schwalbe, MD, the patient's PCP.  I will not be placing any follow-up appointments to the Survivorship Clinic for Ms. Ambriz, but I am happy to see her at any time in the future for any survivorship concerns that may arise. Thank you for allowing me to participate in her care!  Kenn File, [place] [phone]

## 2015-07-04 ENCOUNTER — Encounter: Payer: Self-pay | Admitting: Adult Health

## 2015-07-04 NOTE — Progress Notes (Signed)
A birthday card was mailed to the patient today on behalf of the Survivorship Program at [facility].   Gretchen Dawson, NP Survivorship Program [facility] [phone]  

## 2015-08-07 ENCOUNTER — Other Ambulatory Visit: Payer: Self-pay | Admitting: Obstetrics and Gynecology

## 2015-08-07 DIAGNOSIS — Z853 Personal history of malignant neoplasm of breast: Secondary | ICD-10-CM

## 2015-08-13 ENCOUNTER — Ambulatory Visit
Admission: RE | Admit: 2015-08-13 | Discharge: 2015-08-13 | Disposition: A | Discharge status: Home / Self Care | Payer: BLUE CROSS/BLUE SHIELD | Source: Ambulatory Visit | Attending: Obstetrics and Gynecology | Admitting: Obstetrics and Gynecology

## 2015-08-13 DIAGNOSIS — Z853 Personal history of malignant neoplasm of breast: Secondary | ICD-10-CM

## 2015-10-08 ENCOUNTER — Ambulatory Visit: Payer: BLUE CROSS/BLUE SHIELD | Admitting: Hematology and Oncology

## 2015-10-09 ENCOUNTER — Encounter: Payer: Self-pay | Admitting: Hematology and Oncology

## 2015-10-09 ENCOUNTER — Ambulatory Visit (HOSPITAL_BASED_OUTPATIENT_CLINIC_OR_DEPARTMENT_OTHER): Payer: BLUE CROSS/BLUE SHIELD | Admitting: Hematology and Oncology

## 2015-10-09 VITALS — BP 136/58 | HR 87 | Temp 97.6°F | Resp 18 | Ht 66.0 in | Wt 163.9 lb

## 2015-10-09 DIAGNOSIS — C50412 Malignant neoplasm of upper-outer quadrant of left female breast: Secondary | ICD-10-CM | POA: Diagnosis not present

## 2015-10-09 NOTE — Progress Notes (Signed)
Unable to get in to exam room prior to MD.  No assessment performed.  

## 2015-10-09 NOTE — Assessment & Plan Note (Signed)
Left breast biopsy: DCIS with calcifications, fibroadenoma, low-grade, ER 100%, PR 10% Left lumpectomy 02/27/2015: LCIS with atypical lobular hyperplasia Patient refused radiation therapy and anti-estrogen therapy.  Prognosis: Based upon M.D. Anderson nomogram for DCIS, the risk of recurrence of breast cancer without anything further is a 11% at 10 years.  I will see her back in 6 months to do a breast exam and follow-up.

## 2015-10-09 NOTE — Progress Notes (Signed)
Patient Care Team: Harlan Stains, MD as PCP - General (Family Medicine) Kennith Center, RD as Dietitian (Family Medicine) Arloa Koh, MD as Consulting Physician (Radiation Oncology) Mauro Kaufmann, RN as Registered Nurse Rockwell Germany, RN as Registered Nurse Autumn Messing III, MD as Consulting Physician (General Surgery) Nicholas Lose, MD as Consulting Physician (Hematology and Oncology) Sylvan Cheese, NP as Nurse Practitioner (Nurse Practitioner)  DIAGNOSIS: Breast cancer of upper-outer quadrant of left female breast [facility])   Staging form: Breast, AJCC 7th Edition     Clinical stage from 02/06/2015: Stage 0 (Tis (DCIS), N0, M0) - Unsigned   SUMMARY OF ONCOLOGIC HISTORY:   Breast cancer of upper-outer quadrant of left female breast ([place])   07/05/2013 Mammogram Left breast: 5-6 mm mass in outer quadrant   07/21/2013 Breast US Left breast: No definite correlate seen for the 5-6 mm mass seen on mammography. A small intramammary lymph node is seen at the 2:30 position measuring 0.3 x 0.2 cm. Repeat in 6 months.   12/18/2013 Mammogram Left breast: possible mass noted previously is unchanged. Margins visible are circumscribed. It may be mixed density, possibly an intramammary lymph node. There are no new abnormalities. Repeat in 6 months   07/24/2014 Mammogram Left breast: interval development of an adjacent small cluster of microcalcifications. On spot magnification views,these are coarse and rounded and oval in configuration. No linearly arranged calcifications are seen. These span an area of 53mm   07/24/2014 Breast US Left breast: 7 x 5 x 3 mm oval, horizontally oriented, circumscribed, medium echotexture mass at 3:00 position of the left breast, 2 cm from the nipple. No other abnormalities are seen. Repeat in 6 months.   01/31/2015 Mammogram Six-month reevaluation of left breast calcifications showed increase in calcifications 15 mm span; fibroadenoma measuring 7 mm the 3:00 position stable     01/31/2015 Initial Biopsy Left breast core needle biopsy: DCIS with calcifications, fibroadenoma, low-grade, ER+ (100%), PR+ (10%)   02/01/2015 Clinical Stage Stage 0: Tis N0   02/27/2015 Definitive Surgery Left lumpectomy: atypical lobular hyperplasia and LCIS; no invasive disease.    Radiation Therapy Pt declined    Anti-estrogen oral therapy Pt declined   05/24/2015 Survivorship Survivorship care plan mailed to patient in lieu of in person visit    CHIEF COMPLIANT: follow-up to discuss treatment plan  INTERVAL HISTORY: Misty Lamb is a 60 year old with above-mentioned history of left breast DCIS who underwent lumpectomy and did not wish to undergo radiation or antiestrogen therapy. She is here for routine surveillance. She reports no new problems or concerns. She denies any lumps or nodules in the breasts. She had a recent mammogram which was normal.  REVIEW OF SYSTEMS:   Constitutional: Denies fevers, chills or abnormal weight loss Eyes: Denies blurriness of vision Ears, nose, mouth, throat, and face: Denies mucositis or sore throat Respiratory: Denies cough, dyspnea or wheezes Cardiovascular: Denies palpitation, chest discomfort Gastrointestinal:  Denies nausea, heartburn or change in bowel habits Skin: Denies abnormal skin rashes Lymphatics: Denies new lymphadenopathy or easy bruising Neurological:Denies numbness, tingling or new weaknesses Behavioral/Psych: Mood is stable, no new changes  Extremities: No lower extremity edema Breast:  denies any pain or lumps or nodules in either breasts All other systems were reviewed with the patient and are negative.  I have reviewed the past medical history, past surgical history, social history and family history with the patient and they are unchanged from previous note.  ALLERGIES:  has No Known Allergies.  MEDICATIONS:  Current Outpatient Prescriptions  Medication Sig Dispense Refill  . Multiple Vitamins-Minerals (MULTIVITAMIN ADULT  PO) Take 1 tablet by mouth daily.     Marland Kitchen oxyCODONE-acetaminophen (PERCOCET/ROXICET) 5-325 MG tablet   0   No current facility-administered medications for this visit.    PHYSICAL EXAMINATION: ECOG PERFORMANCE STATUS: 0 - Asymptomatic  Filed Vitals:   10/09/15 1508  BP: 136/58  Pulse: 87  Temp: 97.6 F (36.4 C)  Resp: 18   Filed Weights   10/09/15 1508  Weight: 163 lb 14.4 oz (74.345 kg)    GENERAL:alert, no distress and comfortable SKIN: skin color, texture, turgor are normal, no rashes or significant lesions EYES: normal, Conjunctiva are pink and non-injected, sclera clear OROPHARYNX:no exudate, no erythema and lips, buccal mucosa, and tongue normal  NECK: supple, thyroid normal size, non-tender, without nodularity LYMPH:  no palpable lymphadenopathy in the cervical, axillary or inguinal LUNGS: clear to auscultation and percussion with normal breathing effort HEART: regular rate & rhythm and no murmurs and no lower extremity edema ABDOMEN:abdomen soft, non-tender and normal bowel sounds MUSCULOSKELETAL:no cyanosis of digits and no clubbing  NEURO: alert & oriented x 3 with fluent speech, no focal motor/sensory deficits EXTREMITIES: No lower extremity edema BREAST: No palpable masses or nodules in either right or left breasts. No palpable axillary supraclavicular or infraclavicular adenopathy no breast tenderness or nipple discharge. (exam performed in the presence of a chaperone)  LABORATORY DATA:  I have reviewed the data as listed   Chemistry      Component Value Date/Time   NA 141 02/20/2015 1051   NA 142 02/06/2015 1215   K 3.6 02/20/2015 1051   K 4.0 02/06/2015 1215   CL 105 02/20/2015 1051   CO2 28 02/20/2015 1051   CO2 27 02/06/2015 1215   BUN 10 02/20/2015 1051   BUN 19.2 02/06/2015 1215   CREATININE 0.58 02/20/2015 1051   CREATININE 0.7 02/06/2015 1215      Component Value Date/Time   CALCIUM 9.2 02/20/2015 1051   CALCIUM 9.3 02/06/2015 1215   ALKPHOS  73 02/06/2015 1215   AST 17 02/06/2015 1215   ALT 19 02/06/2015 1215   BILITOT 0.69 02/06/2015 1215       Lab Results  Component Value Date   WBC 6.7 02/20/2015   HGB 14.7 02/20/2015   HCT 43.9 02/20/2015   MCV 88.5 02/20/2015   PLT 260 02/20/2015   NEUTROABS 6.7* 02/06/2015   ASSESSMENT & PLAN:  Breast cancer of upper-outer quadrant of left female breast Left breast biopsy: DCIS with calcifications, fibroadenoma, low-grade, ER 100%, PR 10% Left lumpectomy 02/27/2015: LCIS with atypical lobular hyperplasia Patient refused radiation therapy and anti-estrogen therapy.  Prognosis: Based upon M.D. Anderson nomogram for DCIS, the risk of recurrence of breast cancer without anything further is a 11% at 10 years.  I will see her back in 6 months to do a breast exam and follow-up.  No orders of the defined types were placed in this encounter.   The patient has a good understanding of the overall plan. she agrees with it. she will call with any problems that may develop before the next visit here.   Rulon Eisenmenger, MD 10/09/2015

## 2015-10-10 ENCOUNTER — Telehealth: Payer: Self-pay | Admitting: Hematology and Oncology

## 2015-10-10 NOTE — Telephone Encounter (Signed)
lvmom to inform[pt of 6 mo fu appt 10/5 per pof

## 2015-11-27 DIAGNOSIS — H5213 Myopia, bilateral: Secondary | ICD-10-CM | POA: Diagnosis not present

## 2016-04-08 ENCOUNTER — Telehealth: Payer: Self-pay

## 2016-04-08 NOTE — Telephone Encounter (Signed)
Received [facility] fax indicating pt needed to cancel appt for tomorrow.  Appt cancelled as requested.

## 2016-04-09 ENCOUNTER — Ambulatory Visit: Payer: No Typology Code available for payment source | Admitting: Hematology and Oncology

## 2016-09-17 ENCOUNTER — Other Ambulatory Visit: Payer: Self-pay | Admitting: Obstetrics and Gynecology

## 2016-09-17 DIAGNOSIS — Z1231 Encounter for screening mammogram for malignant neoplasm of breast: Secondary | ICD-10-CM

## 2016-09-24 ENCOUNTER — Ambulatory Visit
Admission: RE | Admit: 2016-09-24 | Discharge: 2016-09-24 | Disposition: A | Discharge status: Home / Self Care | Payer: BLUE CROSS/BLUE SHIELD | Source: Ambulatory Visit | Attending: Obstetrics and Gynecology | Admitting: Obstetrics and Gynecology

## 2016-09-24 DIAGNOSIS — R928 Other abnormal and inconclusive findings on diagnostic imaging of breast: Secondary | ICD-10-CM | POA: Diagnosis not present

## 2016-09-24 DIAGNOSIS — Z1231 Encounter for screening mammogram for malignant neoplasm of breast: Secondary | ICD-10-CM

## 2017-01-20 DIAGNOSIS — Z1212 Encounter for screening for malignant neoplasm of rectum: Secondary | ICD-10-CM | POA: Diagnosis not present

## 2017-01-20 DIAGNOSIS — Z6826 Body mass index (BMI) 26.0-26.9, adult: Secondary | ICD-10-CM | POA: Diagnosis not present

## 2017-01-20 DIAGNOSIS — Z01419 Encounter for gynecological examination (general) (routine) without abnormal findings: Secondary | ICD-10-CM | POA: Diagnosis not present

## 2017-01-27 IMAGING — MG MM LT BREAST BX W LOC DEV 1ST LESION
1 series · 1 of 1 positions shown · non-contrast
Comparison: Previous exams.

ADDENDUM:
Pathology reveals Low grade Left breast ductal carcinoma in situ
with calcifications and fibroadenoma. This was found to be
concordant by Dr. Adalia Heinrich. Pathology results were discussed
with the patient via telephone. The patient reported tenderness at
the biopsy site and is doing well otherwise. Post biopsy
instructions were reviewed and questions were answered. The patient
was encouraged to call The [REDACTED] with
any additional questions and or concerns. The patient was referred
to [REDACTED] [facility] on February 06, 2015.

Pathology results reported by Delmira Dutta RN on February 01, 2015.
:
Please insert correct template.
CLINICAL DATA: Stereotactic biopsy is recommended of calcifications
in the upper-outer quadrant of the left breast.
EXAM:
LEFT BREAST STEREOTACTIC CORE NEEDLE BIOPSY

[L SPECIMEN]
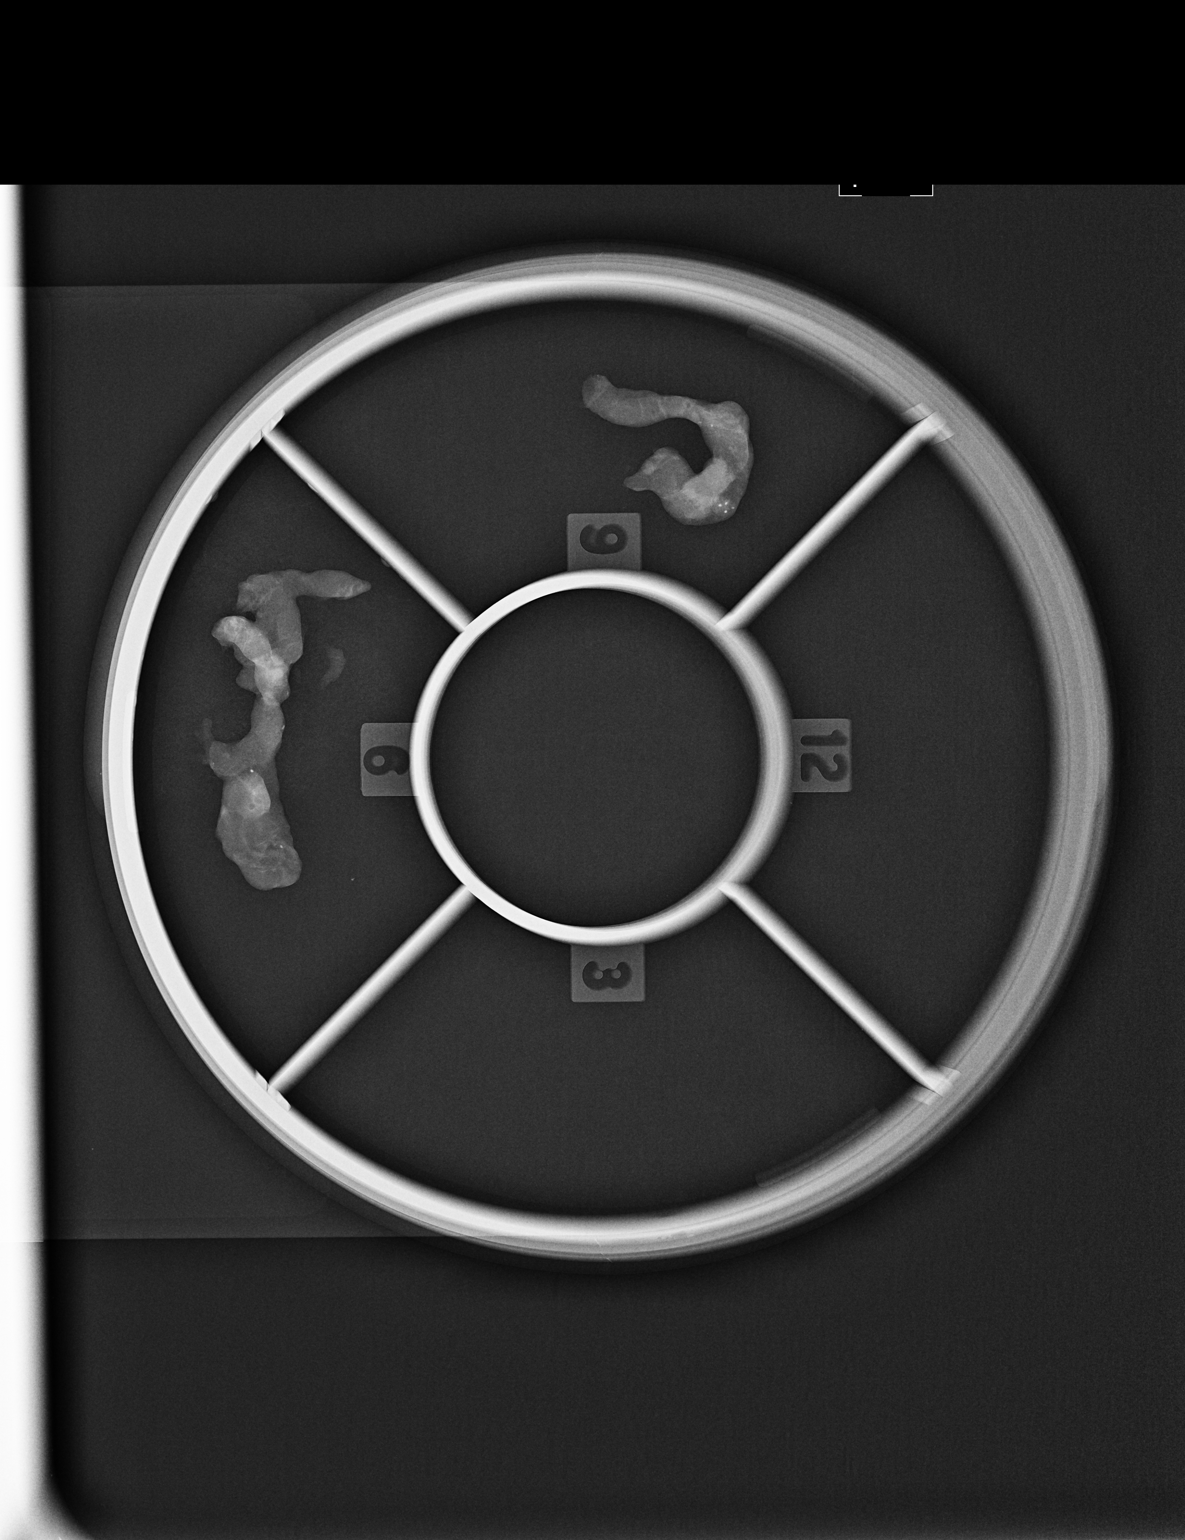

[1 of 1 positions shown; findings below may reference images not displayed]



Using sterile technique and 2% Lidocaine as local anesthetic, under
stereotactic guidance, a 9 gauge vacuum assisted device was used to
perform core needle biopsy of calcifications in the upper-outer
quadrant of the left breast using a superior to inferior approach.
Specimen radiograph was performed showing several calcifications.
Specimens with calcifications are identified for pathology.

At the conclusion of the procedure, a coil shaped tissue marker clip
was deployed into the biopsy cavity. Follow-up 2-view mammogram was
performed and dictated separately.
IMPRESSION: Stereotactic-guided biopsy of left breast. No apparent
complications.

## 2017-01-27 IMAGING — US US BREAST LTD UNI LEFT INC AXILLA
1 series · 4 of 4 positions shown · non-contrast
Comparison: 07/24/2014, 12/18/2013, 07/21/2013, 07/05/2013

CLINICAL DATA: Six month followup of calcifications in the left
breast and a probable fibroadenoma in the left breast at 3 o'clock
position.

EXAM:
DIGITAL DIAGNOSTIC LEFT MAMMOGRAM WITH 3D TOMOSYNTHESIS WITH CAD
ULTRASOUND LEFT BREAST

[Series 1: us breast ltd uni left inc axilla · 0.06mm/px · 4 of 4 slices shown]
[im 1/4]
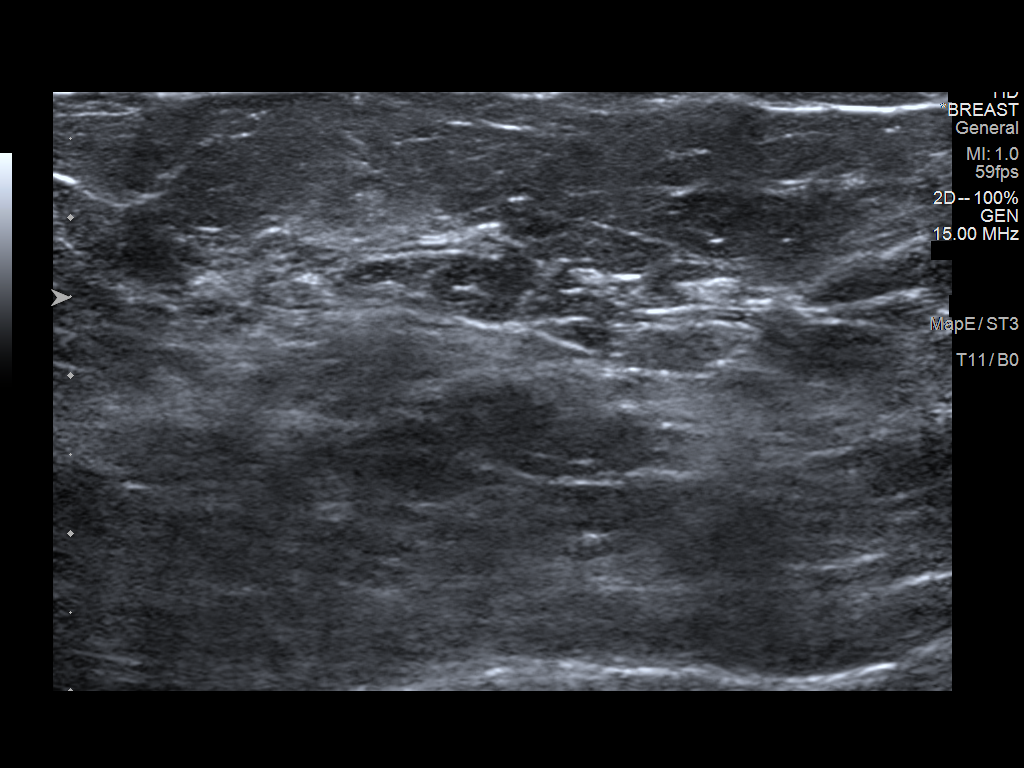
[im 2/4]
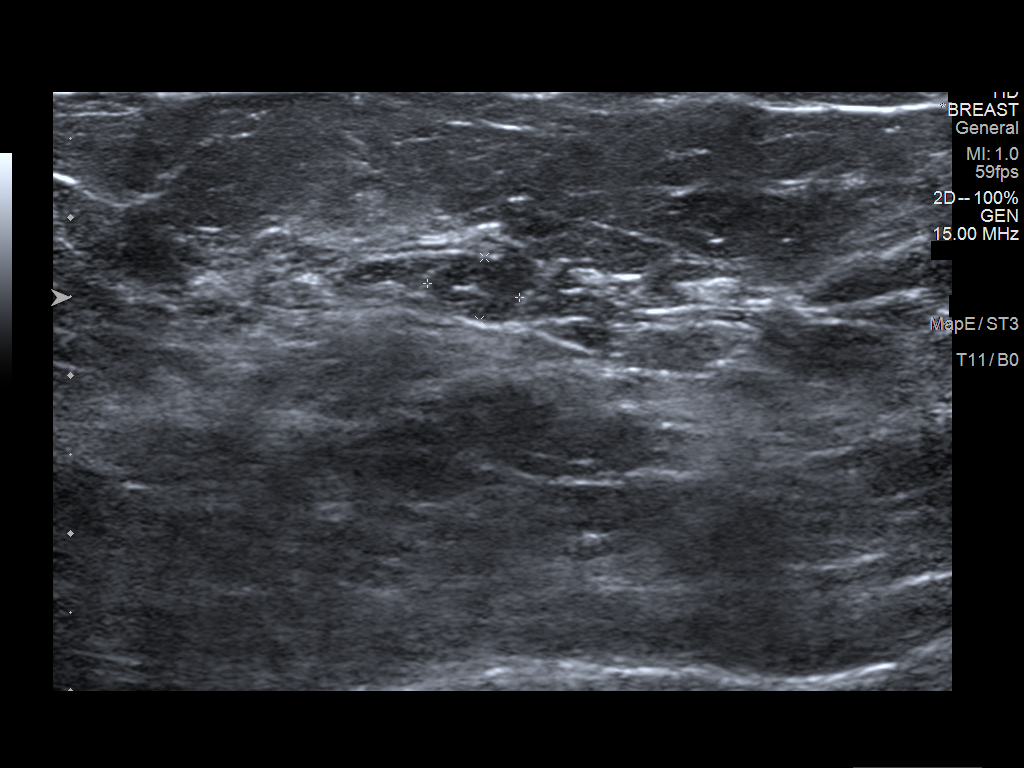
[im 3/4]
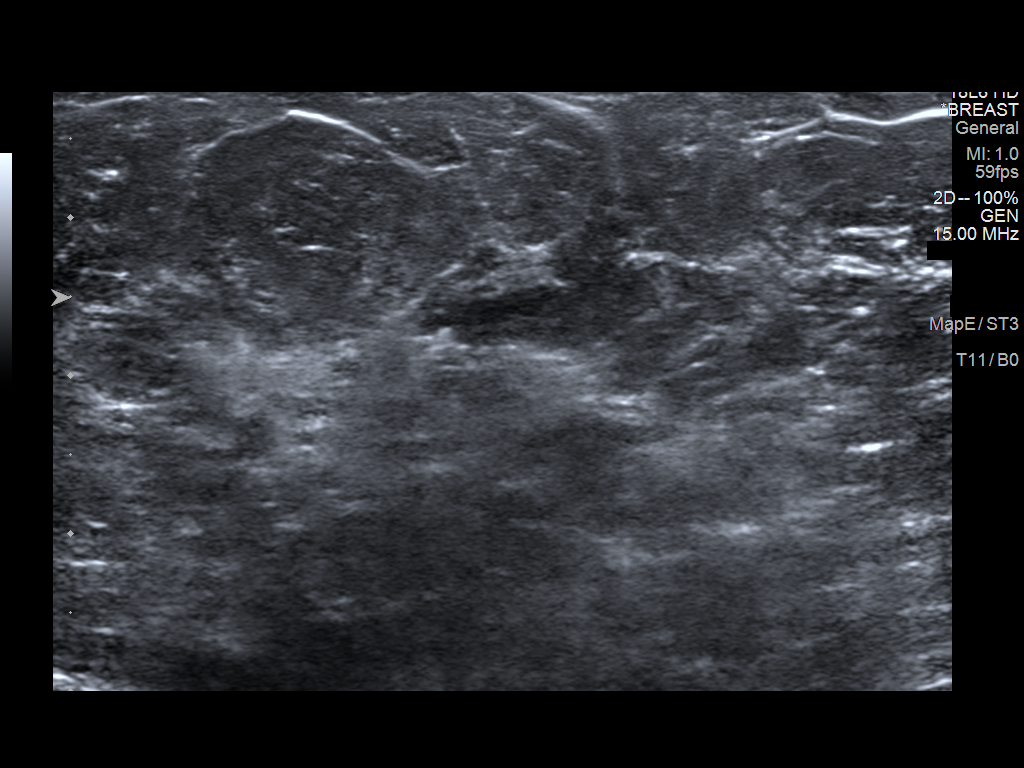
[im 4/4]
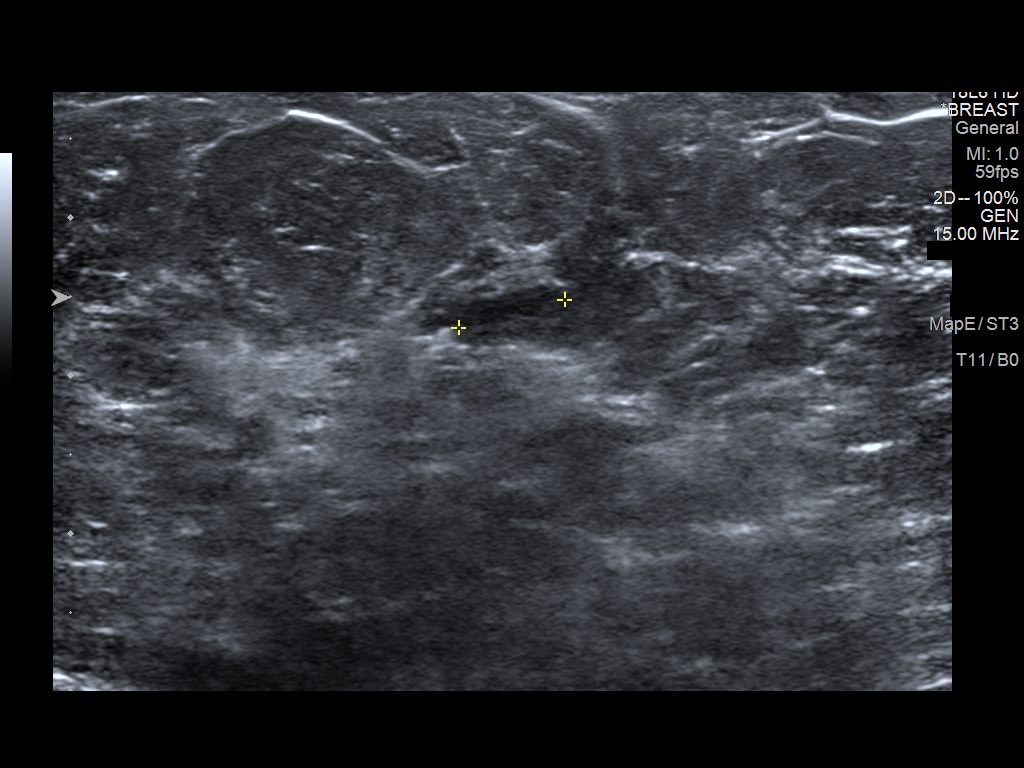

[4 of 4 positions shown; findings below may reference images not displayed]

ACR Breast Density Category b: There are scattered areas of
fibroglandular density.
FINDINGS: No new or suspicious mass or distortion is identified in the left
breast. Magnification views of the upper outer left breast, middle
third, demonstrate a tight group of calcifications that span 3 x 3 x
4 mm. Calcifications appear slightly less rounded on today's images
and pain did on the images of 07/24/2014, particularly in the ML
projection. There are a few new calcifications directly posterior to
this tight group of calcifications that are punctate and rounded. No
associated mass or distortion. In total, the calcifications in the
outer left breast span 10 x 11 x 15 mm.

Mammographic images were processed with CAD.

On physical exam, no mass is palpated in the outer left breast.

Targeted ultrasound is performed, showing an isoechoic to slightly
hypoechoic oval nodule at 3 o'clock position 2 cm from the nipple
measuring 7 x 6 x 4 mm at 3 o'clock position. This is without
significant change since prior ultrasound.
IMPRESSION: 1. Microcalcifications in the upper-outer quadrant of the left
breast are slightly heterogeneous. Biopsy is suggested for
pathologic diagnosis, as ductal carcinoma in situ cannot be excluded
by the imaging appearance.
2. Similar appearance of a probably benign small nodule in the left
breast at 3 o'clock position. On ultrasound, this could reflect a
focal fat lobule or benign fibroadenoma.

RECOMMENDATION:
Stereotactic biopsy left breast calcifications is scheduled for
today, 01/31/2015.

I have discussed the findings and recommendations with the patient.
Results were also provided in writing at the conclusion of the
visit. If applicable, a reminder letter will be sent to the patient
regarding the next appointment.

BI-RADS CATEGORY  4: Suspicious.

## 2017-03-24 DIAGNOSIS — M79671 Pain in right foot: Secondary | ICD-10-CM | POA: Diagnosis not present

## 2017-03-24 DIAGNOSIS — L0100 Impetigo, unspecified: Secondary | ICD-10-CM | POA: Diagnosis not present

## 2017-04-28 DIAGNOSIS — D229 Melanocytic nevi, unspecified: Secondary | ICD-10-CM | POA: Diagnosis not present

## 2017-04-28 DIAGNOSIS — K602 Anal fissure, unspecified: Secondary | ICD-10-CM | POA: Diagnosis not present

## 2017-04-28 DIAGNOSIS — L821 Other seborrheic keratosis: Secondary | ICD-10-CM | POA: Diagnosis not present

## 2017-09-09 ENCOUNTER — Other Ambulatory Visit: Payer: Self-pay | Admitting: Obstetrics and Gynecology

## 2017-09-09 DIAGNOSIS — Z853 Personal history of malignant neoplasm of breast: Secondary | ICD-10-CM

## 2017-09-29 ENCOUNTER — Ambulatory Visit
Admission: RE | Admit: 2017-09-29 | Discharge: 2017-09-29 | Disposition: A | Discharge status: Home / Self Care | Payer: BLUE CROSS/BLUE SHIELD | Source: Ambulatory Visit | Attending: Obstetrics and Gynecology | Admitting: Obstetrics and Gynecology

## 2017-09-29 DIAGNOSIS — Z853 Personal history of malignant neoplasm of breast: Secondary | ICD-10-CM

## 2017-09-29 DIAGNOSIS — R928 Other abnormal and inconclusive findings on diagnostic imaging of breast: Secondary | ICD-10-CM | POA: Diagnosis not present

## 2018-02-23 DIAGNOSIS — H00025 Hordeolum internum left lower eyelid: Secondary | ICD-10-CM | POA: Diagnosis not present

## 2018-02-24 DIAGNOSIS — Z01419 Encounter for gynecological examination (general) (routine) without abnormal findings: Secondary | ICD-10-CM | POA: Diagnosis not present

## 2018-02-24 DIAGNOSIS — Z1212 Encounter for screening for malignant neoplasm of rectum: Secondary | ICD-10-CM | POA: Diagnosis not present

## 2018-02-24 DIAGNOSIS — N958 Other specified menopausal and perimenopausal disorders: Secondary | ICD-10-CM | POA: Diagnosis not present

## 2018-02-24 DIAGNOSIS — Z13820 Encounter for screening for osteoporosis: Secondary | ICD-10-CM | POA: Diagnosis not present

## 2018-02-24 DIAGNOSIS — Z6825 Body mass index (BMI) 25.0-25.9, adult: Secondary | ICD-10-CM | POA: Diagnosis not present

## 2018-02-24 DIAGNOSIS — M816 Localized osteoporosis [Lequesne]: Secondary | ICD-10-CM | POA: Diagnosis not present

## 2018-11-30 ENCOUNTER — Other Ambulatory Visit: Payer: Self-pay | Admitting: Obstetrics and Gynecology

## 2018-11-30 DIAGNOSIS — Z9889 Other specified postprocedural states: Secondary | ICD-10-CM

## 2018-12-22 ENCOUNTER — Ambulatory Visit
Admission: RE | Admit: 2018-12-22 | Discharge: 2018-12-22 | Disposition: A | Discharge status: Home / Self Care | Payer: BC Managed Care – PPO | Source: Ambulatory Visit | Attending: Obstetrics and Gynecology | Admitting: Obstetrics and Gynecology

## 2018-12-22 ENCOUNTER — Other Ambulatory Visit: Payer: Self-pay

## 2018-12-22 DIAGNOSIS — Z853 Personal history of malignant neoplasm of breast: Secondary | ICD-10-CM | POA: Diagnosis not present

## 2018-12-22 DIAGNOSIS — Z9889 Other specified postprocedural states: Secondary | ICD-10-CM

## 2018-12-22 DIAGNOSIS — R928 Other abnormal and inconclusive findings on diagnostic imaging of breast: Secondary | ICD-10-CM | POA: Diagnosis not present

## 2019-01-12 DIAGNOSIS — H2513 Age-related nuclear cataract, bilateral: Secondary | ICD-10-CM | POA: Diagnosis not present

## 2019-04-19 DIAGNOSIS — H1045 Other chronic allergic conjunctivitis: Secondary | ICD-10-CM | POA: Diagnosis not present

## 2019-07-25 DIAGNOSIS — M79672 Pain in left foot: Secondary | ICD-10-CM | POA: Insufficient documentation

## 2019-07-25 DIAGNOSIS — M79671 Pain in right foot: Secondary | ICD-10-CM | POA: Insufficient documentation

## 2020-03-15 ENCOUNTER — Other Ambulatory Visit: Payer: Self-pay | Admitting: Obstetrics and Gynecology

## 2020-03-15 DIAGNOSIS — Z853 Personal history of malignant neoplasm of breast: Secondary | ICD-10-CM

## 2020-03-15 DIAGNOSIS — Z9889 Other specified postprocedural states: Secondary | ICD-10-CM

## 2020-04-03 ENCOUNTER — Ambulatory Visit
Admission: RE | Admit: 2020-04-03 | Discharge: 2020-04-03 | Disposition: A | Discharge status: Home / Self Care | Payer: BC Managed Care – PPO | Source: Ambulatory Visit | Attending: Obstetrics and Gynecology | Admitting: Obstetrics and Gynecology

## 2020-04-03 ENCOUNTER — Other Ambulatory Visit: Payer: Self-pay

## 2020-04-03 DIAGNOSIS — Z9889 Other specified postprocedural states: Secondary | ICD-10-CM

## 2020-04-03 DIAGNOSIS — Z853 Personal history of malignant neoplasm of breast: Secondary | ICD-10-CM

## 2020-12-18 IMAGING — MG DIGITAL DIAGNOSTIC BILATERAL MAMMOGRAM WITH TOMO AND CAD
9 series · 9 of 25 positions shown · non-contrast
Comparison: Previous exam(s).

CLINICAL DATA: History of left breast cancer status post lumpectomy
in 4368.

EXAM:
DIGITAL DIAGNOSTIC BILATERAL MAMMOGRAM WITH CAD AND TOMO

[L MLO]
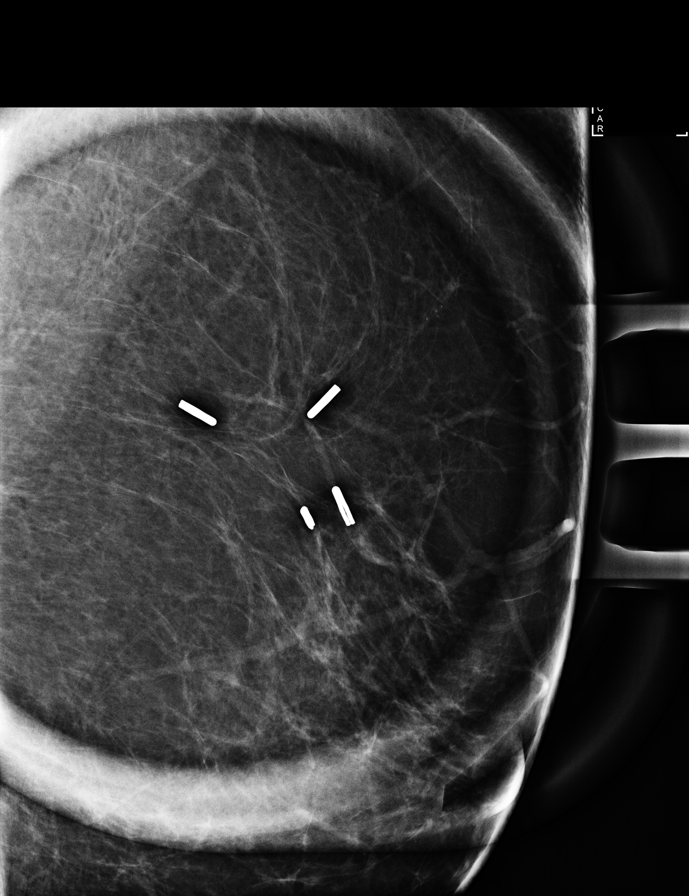

[L CC synth-2D]
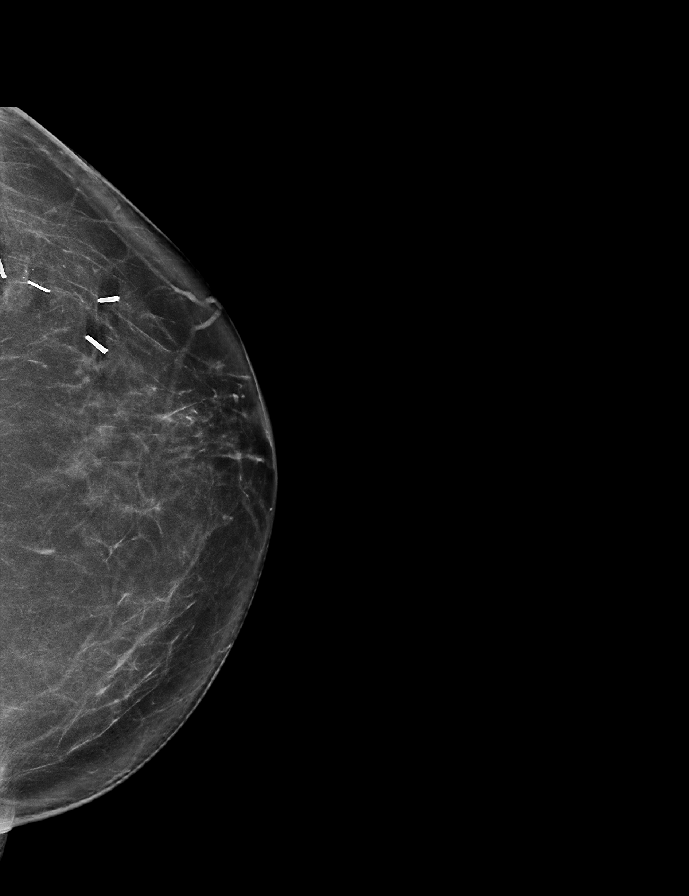

[R MLO synth-2D]
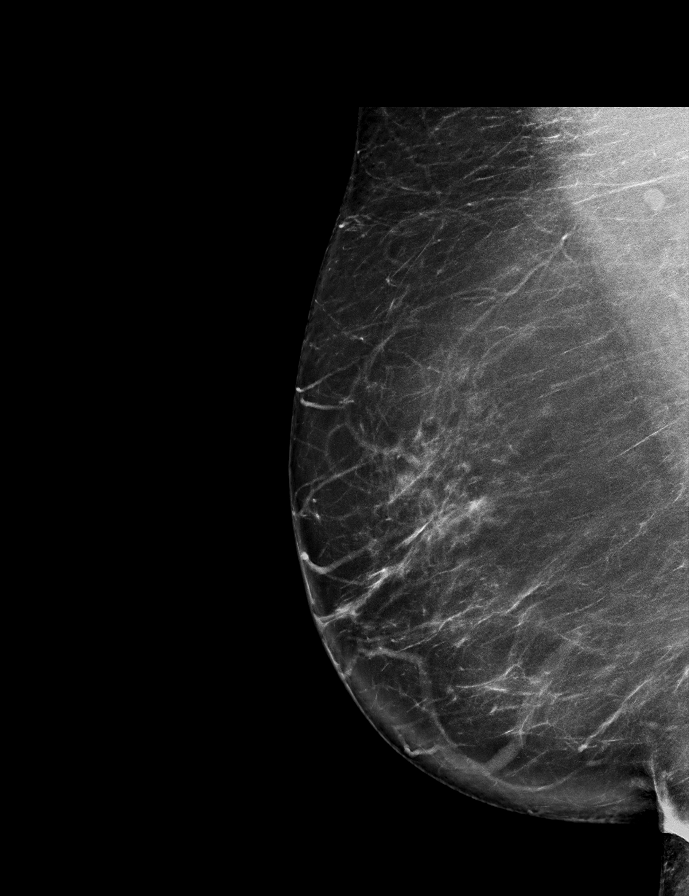

[R CC synth-2D]
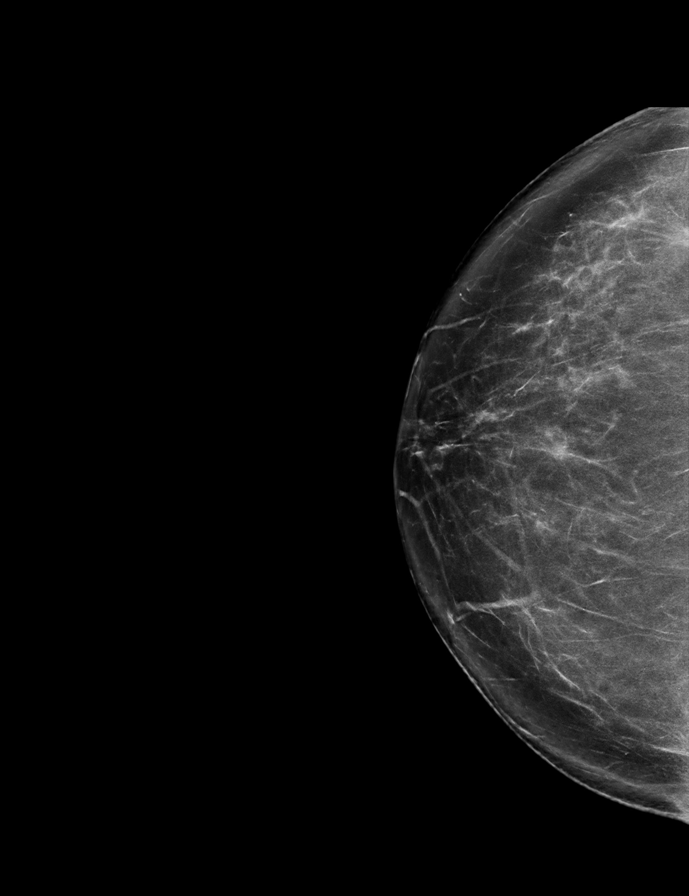

[L MLO synth-2D]
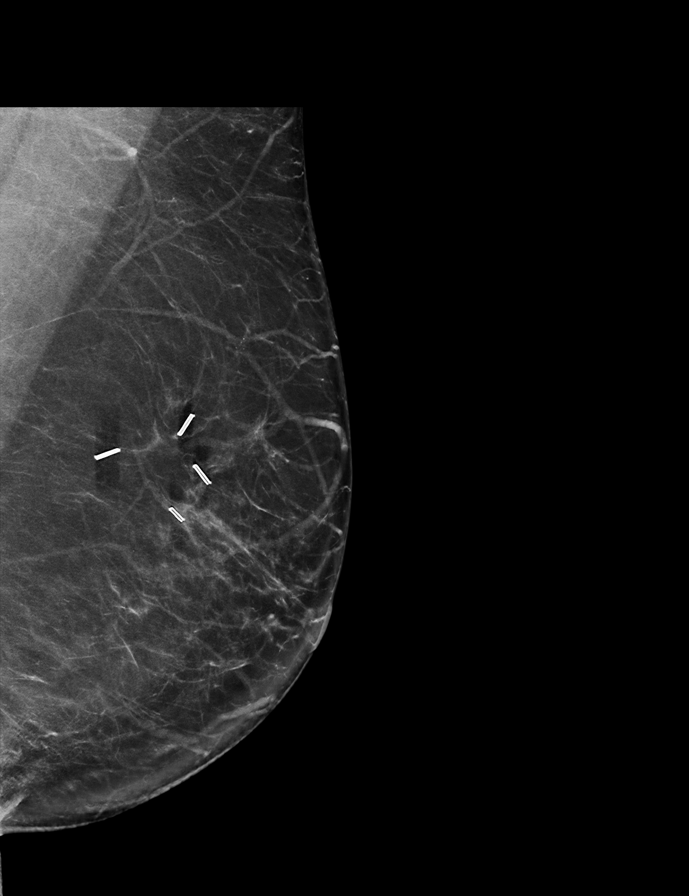

[L CC tomo · tomo slice 39/76.0]
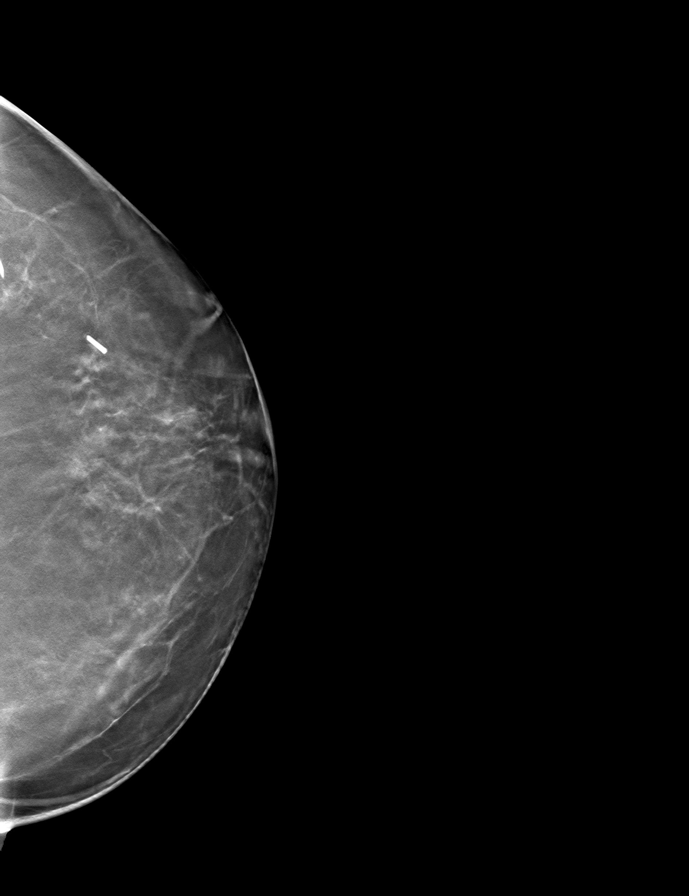

[L MLO tomo · tomo slice 41/80.0]
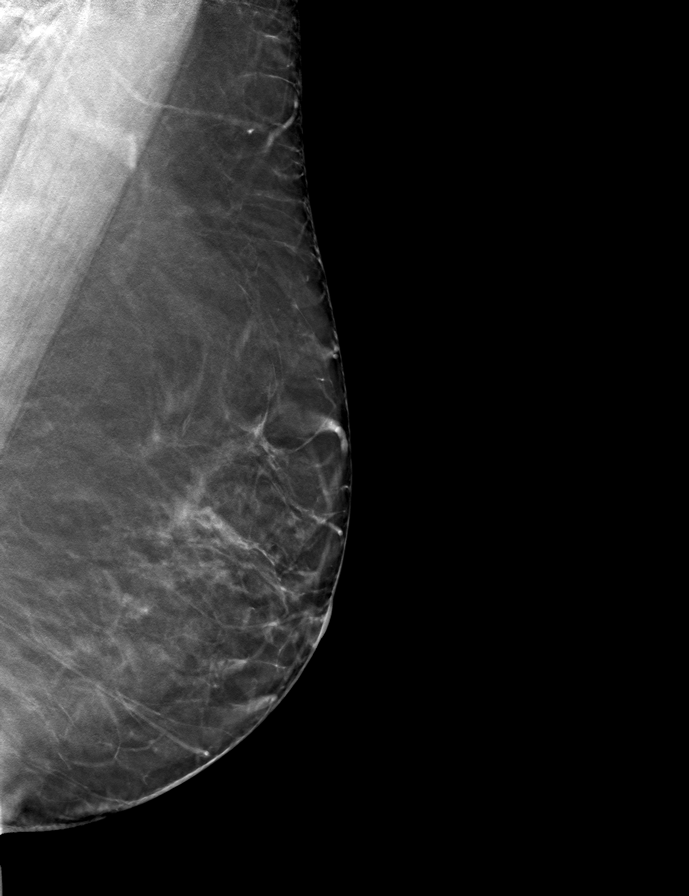

[R CC tomo · tomo slice 41/81.0]
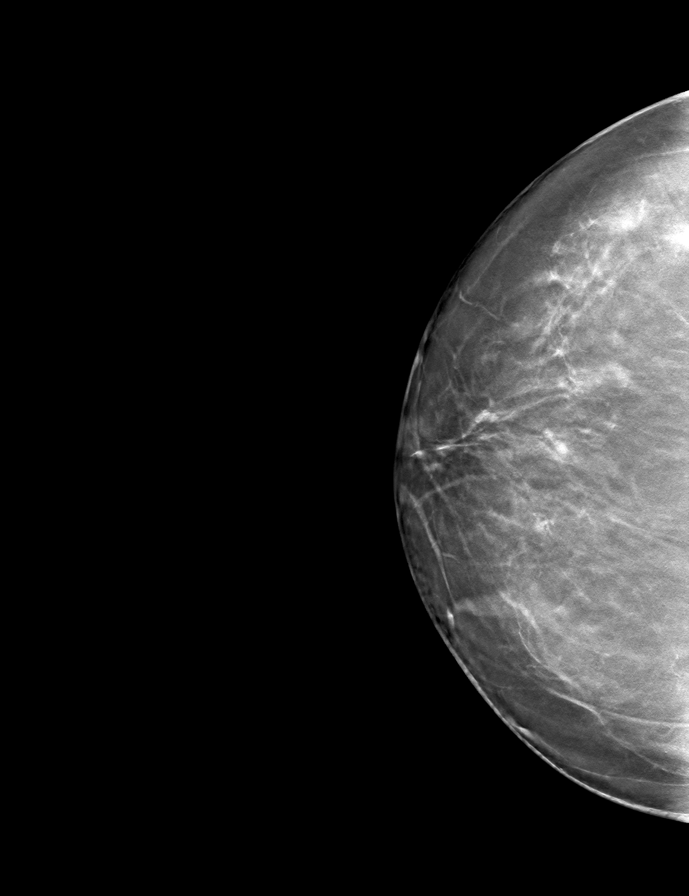

[R MLO tomo · tomo slice 46/91.0]
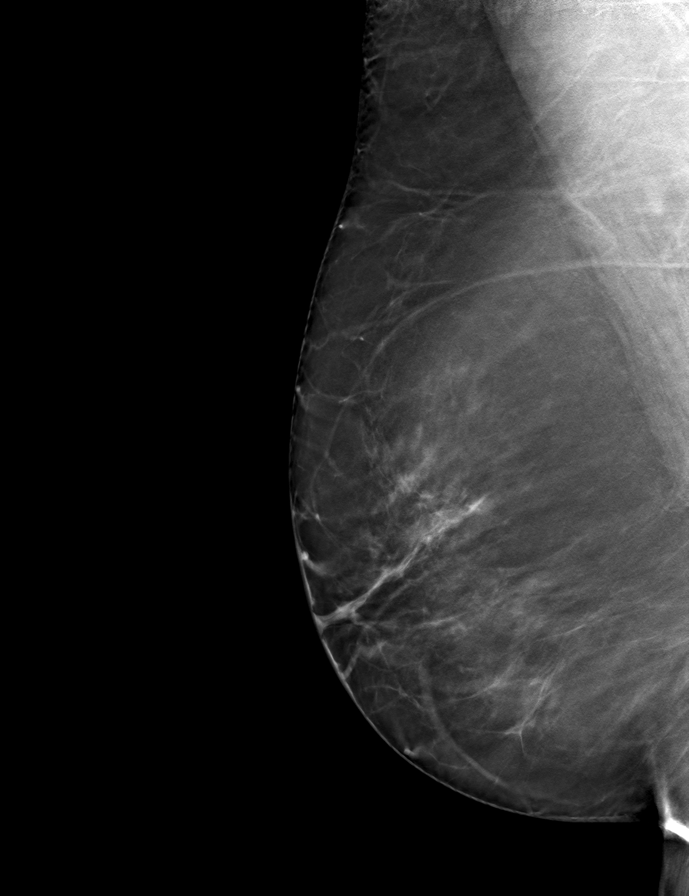

[9 of 25 positions shown; findings below may reference images not displayed]

ACR Breast Density Category b: There are scattered areas of
fibroglandular density.
FINDINGS: Stable lumpectomy changes are seen in the left breast. No suspicious
mass or malignant type microcalcifications identified in either
breast.

Mammographic images were processed with CAD.
IMPRESSION: No evidence of malignancy in either breast.

RECOMMENDATION:
Bilateral diagnostic mammogram in 1 year is recommended.

I have discussed the findings and recommendations with the patient.
Results were also provided in writing at the conclusion of the
visit. If applicable, a reminder letter will be sent to the patient
regarding the next appointment.

BI-RADS CATEGORY  2: Benign.

## 2021-06-05 ENCOUNTER — Other Ambulatory Visit: Payer: Self-pay | Admitting: Obstetrics and Gynecology

## 2021-06-05 DIAGNOSIS — Z1231 Encounter for screening mammogram for malignant neoplasm of breast: Secondary | ICD-10-CM

## 2021-06-06 ENCOUNTER — Other Ambulatory Visit: Payer: Self-pay

## 2021-06-06 ENCOUNTER — Ambulatory Visit
Admission: RE | Admit: 2021-06-06 | Discharge: 2021-06-06 | Disposition: A | Discharge status: Home / Self Care | Payer: BC Managed Care – PPO | Source: Ambulatory Visit | Attending: Obstetrics and Gynecology | Admitting: Obstetrics and Gynecology

## 2021-06-06 DIAGNOSIS — Z1231 Encounter for screening mammogram for malignant neoplasm of breast: Secondary | ICD-10-CM

## 2021-07-10 ENCOUNTER — Ambulatory Visit: Payer: BC Managed Care – PPO

## 2022-03-06 DIAGNOSIS — M5416 Radiculopathy, lumbar region: Secondary | ICD-10-CM | POA: Insufficient documentation

## 2022-05-01 ENCOUNTER — Other Ambulatory Visit: Payer: Self-pay | Admitting: Obstetrics and Gynecology

## 2022-05-01 DIAGNOSIS — Z1231 Encounter for screening mammogram for malignant neoplasm of breast: Secondary | ICD-10-CM

## 2022-07-02 ENCOUNTER — Ambulatory Visit
Admission: RE | Admit: 2022-07-02 | Discharge: 2022-07-02 | Disposition: A | Discharge status: Home / Self Care | Payer: BC Managed Care – PPO | Source: Ambulatory Visit | Attending: Obstetrics and Gynecology | Admitting: Obstetrics and Gynecology

## 2022-07-02 DIAGNOSIS — Z1231 Encounter for screening mammogram for malignant neoplasm of breast: Secondary | ICD-10-CM

## 2023-07-22 ENCOUNTER — Other Ambulatory Visit: Payer: Self-pay | Admitting: Obstetrics and Gynecology

## 2023-07-22 DIAGNOSIS — Z1231 Encounter for screening mammogram for malignant neoplasm of breast: Secondary | ICD-10-CM

## 2023-08-12 ENCOUNTER — Ambulatory Visit
Admission: RE | Admit: 2023-08-12 | Discharge: 2023-08-12 | Disposition: A | Discharge status: Home / Self Care | Payer: Medicare Other | Source: Ambulatory Visit | Attending: Obstetrics and Gynecology | Admitting: Obstetrics and Gynecology

## 2023-08-12 DIAGNOSIS — Z1231 Encounter for screening mammogram for malignant neoplasm of breast: Secondary | ICD-10-CM

## 2023-08-30 ENCOUNTER — Ambulatory Visit (INDEPENDENT_AMBULATORY_CARE_PROVIDER_SITE_OTHER): Payer: Medicare Other | Admitting: Podiatry

## 2023-08-30 ENCOUNTER — Encounter: Payer: Self-pay | Admitting: Podiatry

## 2023-08-30 DIAGNOSIS — M8580 Other specified disorders of bone density and structure, unspecified site: Secondary | ICD-10-CM | POA: Insufficient documentation

## 2023-08-30 DIAGNOSIS — R87619 Unspecified abnormal cytological findings in specimens from cervix uteri: Secondary | ICD-10-CM | POA: Insufficient documentation

## 2023-08-30 DIAGNOSIS — Z853 Personal history of malignant neoplasm of breast: Secondary | ICD-10-CM | POA: Insufficient documentation

## 2023-08-30 DIAGNOSIS — M722 Plantar fascial fibromatosis: Secondary | ICD-10-CM

## 2023-08-30 NOTE — Patient Instructions (Signed)

## 2023-08-30 NOTE — Progress Notes (Unsigned)
  Subjective:  Patient ID: Misty Lamb, female    DOB: 1955/11/07,  MRN: [id number]  Chief Complaint  Patient presents with   Foot Orthotics    Pt stated that she is interested in getting orthotics she stated that she has a history of plantar fasciitis     68 y.o. female presents with the above complaint. History confirmed with patient.  She occasionally takes Aleve or ibuprofen when she needs it.  It was severe 10 out of 10 pain about a month ago and seems to be on the mend now.   Objective:  Physical Exam: warm, good capillary refill, no trophic changes or ulcerative lesions, normal DP and PT pulses, and normal sensory exam. Left Foot: normal exam, no swelling, tenderness, instability; ligaments intact, full range of motion of all ankle/foot joints Right Foot: point tenderness over the heel pad   Assessment:   1. Plantar fasciitis of right foot      Plan:  Patient was evaluated and treated and all questions answered.  Discussed the etiology and treatment options for plantar fasciitis including stretching, formal physical therapy, supportive shoegears such as a running shoe or sneaker, pre fabricated orthoses, injection therapy, and oral medications. We also discussed the role of surgical treatment of this for patients who do not improve after exhausting non-surgical treatment options.   -Educated patient on stretching and icing of the affected limb -We discussed the option of corticosteroid injection if it is not improving or worsens at any point, did not feel that she needed this today. -I also discussed orthotic support.  She met with our orthotist to be casted for custom molded foot orthosis to provide long-term longitudinal arch support and offloading of the heel pad.  She discussed she would be interested in a second pair for dress shoes to be made after having these   Return in about 3 months (around 11/27/2023) for recheck plantar fasciitis.

## 2023-10-05 ENCOUNTER — Other Ambulatory Visit: Payer: Medicare Other

## 2023-10-05 ENCOUNTER — Telehealth: Payer: Self-pay

## 2023-10-05 NOTE — Telephone Encounter (Signed)
 Left VM to reschedule appt due to tricia having a family emergency

## 2023-10-06 ENCOUNTER — Ambulatory Visit (INDEPENDENT_AMBULATORY_CARE_PROVIDER_SITE_OTHER)

## 2023-10-06 DIAGNOSIS — M2142 Flat foot [pes planus] (acquired), left foot: Secondary | ICD-10-CM

## 2023-10-06 DIAGNOSIS — M722 Plantar fascial fibromatosis: Secondary | ICD-10-CM | POA: Diagnosis not present

## 2023-10-06 DIAGNOSIS — M2141 Flat foot [pes planus] (acquired), right foot: Secondary | ICD-10-CM

## 2023-10-06 NOTE — Progress Notes (Signed)
 Patient presents today to pick up custom molded foot orthotics, diagnosed with PF by Dr. Lilian Kapur.   Orthotics were dispensed and fit was satisfactory. Reviewed instructions for break-in and wear. Written instructions given to patient.  Patient will follow up as needed.   [name] Cped, CFo, CFm

## 2023-11-30 ENCOUNTER — Ambulatory Visit: Payer: Medicare Other | Admitting: Podiatry

## 2024-07-11 ENCOUNTER — Ambulatory Visit: Admitting: Podiatrist

## 2024-07-11 DIAGNOSIS — M722 Plantar fascial fibromatosis: Secondary | ICD-10-CM

## 2024-07-11 DIAGNOSIS — M2142 Flat foot [pes planus] (acquired), left foot: Secondary | ICD-10-CM

## 2024-07-11 DIAGNOSIS — M2141 Flat foot [pes planus] (acquired), right foot: Secondary | ICD-10-CM

## 2024-07-11 NOTE — Progress Notes (Signed)
 Visit:  ORTHOTIC SCAN/ EVALUATION  Patient presented for evaluation/ scan for custom molded foot orthotics.  Patient will benefit from custom foot orthotics to provide total contact to bilateral medial longitudinal arches to help balance and distribute body weight more evenly.  Thus reducing plantar pressure and pain.   Orthotic will encourage forefoot and rearfoot alignment.    Patient was scanned today with [facility] scanner.    Orthotics are ordered.  Signature obtained for notification of pricing/ fees for the device.  When the orthotic is ready for pick up, will call to make an appointment for a fitting.    Lamarr Salen, DPM    Ordered 1/9 called back to let us  know she wants 1 pr dress and  1 pr regular sport-

## 2024-07-14 ENCOUNTER — Telehealth: Payer: Self-pay | Admitting: Podiatrist

## 2024-07-14 NOTE — Telephone Encounter (Signed)
 Patient called stating that she would like the slim pr dress and the same pair that she already has for sport.

## 2024-07-27 ENCOUNTER — Telehealth: Payer: Self-pay | Admitting: Podiatry

## 2024-07-27 DIAGNOSIS — M722 Plantar fascial fibromatosis: Secondary | ICD-10-CM

## 2024-07-27 NOTE — Telephone Encounter (Signed)
 Received 2 pair of orthotics in [place] office. Left a message for Lashaundra to pick up. No appointment needed.
